# Patient Record
Sex: Male | Born: 1970 | Race: White | Hispanic: No | Smoking: Current every day smoker
Health system: Southern US, Community
[De-identification: ages and names within clinical notes are randomized; demographics above are authoritative.]

---

## 2020-11-26 DIAGNOSIS — Z20822 Contact with and (suspected) exposure to covid-19: Secondary | ICD-10-CM | POA: Diagnosis not present

## 2020-11-26 DIAGNOSIS — U071 COVID-19: Secondary | ICD-10-CM | POA: Diagnosis not present

## 2021-12-22 DIAGNOSIS — G44209 Tension-type headache, unspecified, not intractable: Secondary | ICD-10-CM | POA: Diagnosis not present

## 2021-12-26 ENCOUNTER — Encounter (HOSPITAL_COMMUNITY): Payer: Self-pay | Admitting: *Deleted

## 2021-12-26 ENCOUNTER — Emergency Department (HOSPITAL_COMMUNITY): Payer: BC Managed Care – PPO

## 2021-12-26 ENCOUNTER — Inpatient Hospital Stay (HOSPITAL_COMMUNITY)
Admission: EM | Admit: 2021-12-26 | Discharge: 2022-01-02 | DRG: 025 | Disposition: A | Discharge status: Rehabilitation Facility | Payer: BC Managed Care – PPO | Attending: Neurosurgery | Admitting: Neurosurgery

## 2021-12-26 DIAGNOSIS — Z20822 Contact with and (suspected) exposure to covid-19: Secondary | ICD-10-CM | POA: Diagnosis present

## 2021-12-26 DIAGNOSIS — Z7189 Other specified counseling: Secondary | ICD-10-CM | POA: Diagnosis not present

## 2021-12-26 DIAGNOSIS — G939 Disorder of brain, unspecified: Secondary | ICD-10-CM | POA: Diagnosis not present

## 2021-12-26 DIAGNOSIS — C719 Malignant neoplasm of brain, unspecified: Secondary | ICD-10-CM | POA: Diagnosis not present

## 2021-12-26 DIAGNOSIS — C729 Malignant neoplasm of central nervous system, unspecified: Secondary | ICD-10-CM | POA: Diagnosis not present

## 2021-12-26 DIAGNOSIS — I96 Gangrene, not elsewhere classified: Secondary | ICD-10-CM | POA: Diagnosis not present

## 2021-12-26 DIAGNOSIS — G936 Cerebral edema: Secondary | ICD-10-CM | POA: Diagnosis not present

## 2021-12-26 DIAGNOSIS — G9389 Other specified disorders of brain: Secondary | ICD-10-CM

## 2021-12-26 DIAGNOSIS — Z515 Encounter for palliative care: Secondary | ICD-10-CM | POA: Diagnosis not present

## 2021-12-26 DIAGNOSIS — G935 Compression of brain: Secondary | ICD-10-CM | POA: Diagnosis present

## 2021-12-26 DIAGNOSIS — R220 Localized swelling, mass and lump, head: Secondary | ICD-10-CM | POA: Diagnosis not present

## 2021-12-26 DIAGNOSIS — K5900 Constipation, unspecified: Secondary | ICD-10-CM | POA: Diagnosis not present

## 2021-12-26 DIAGNOSIS — E8809 Other disorders of plasma-protein metabolism, not elsewhere classified: Secondary | ICD-10-CM | POA: Diagnosis not present

## 2021-12-26 DIAGNOSIS — F17210 Nicotine dependence, cigarettes, uncomplicated: Secondary | ICD-10-CM | POA: Diagnosis present

## 2021-12-26 DIAGNOSIS — R269 Unspecified abnormalities of gait and mobility: Secondary | ICD-10-CM | POA: Diagnosis not present

## 2021-12-26 DIAGNOSIS — R4587 Impulsiveness: Secondary | ICD-10-CM | POA: Diagnosis present

## 2021-12-26 DIAGNOSIS — D496 Neoplasm of unspecified behavior of brain: Secondary | ICD-10-CM | POA: Diagnosis not present

## 2021-12-26 DIAGNOSIS — R739 Hyperglycemia, unspecified: Secondary | ICD-10-CM | POA: Diagnosis not present

## 2021-12-26 DIAGNOSIS — R5381 Other malaise: Secondary | ICD-10-CM | POA: Diagnosis not present

## 2021-12-26 DIAGNOSIS — E669 Obesity, unspecified: Secondary | ICD-10-CM | POA: Diagnosis not present

## 2021-12-26 DIAGNOSIS — C712 Malignant neoplasm of temporal lobe: Principal | ICD-10-CM | POA: Diagnosis present

## 2021-12-26 DIAGNOSIS — R519 Headache, unspecified: Secondary | ICD-10-CM | POA: Diagnosis not present

## 2021-12-26 DIAGNOSIS — Z6825 Body mass index (BMI) 25.0-25.9, adult: Secondary | ICD-10-CM | POA: Diagnosis not present

## 2021-12-26 DIAGNOSIS — R4189 Other symptoms and signs involving cognitive functions and awareness: Secondary | ICD-10-CM | POA: Diagnosis not present

## 2021-12-26 LAB — CBC WITH DIFFERENTIAL/PLATELET
Abs Immature Granulocytes: 0.05 10*3/uL (ref 0.00–0.07)
Basophils Absolute: 0.1 10*3/uL (ref 0.0–0.1)
Basophils Relative: 1 %
Eosinophils Absolute: 0.1 10*3/uL (ref 0.0–0.5)
Eosinophils Relative: 1 %
HCT: 55.5 % — ABNORMAL HIGH (ref 39.0–52.0)
Hemoglobin: 18 g/dL — ABNORMAL HIGH (ref 13.0–17.0)
Immature Granulocytes: 0 %
Lymphocytes Relative: 13 %
Lymphs Abs: 2.2 10*3/uL (ref 0.7–4.0)
MCH: 30.5 pg (ref 26.0–34.0)
MCHC: 32.4 g/dL (ref 30.0–36.0)
MCV: 93.9 fL (ref 80.0–100.0)
Monocytes Absolute: 0.7 10*3/uL (ref 0.1–1.0)
Monocytes Relative: 4 %
Neutro Abs: 13.3 10*3/uL — ABNORMAL HIGH (ref 1.7–7.7)
Neutrophils Relative %: 81 %
Platelets: 339 10*3/uL (ref 150–400)
RBC: 5.91 MIL/uL — ABNORMAL HIGH (ref 4.22–5.81)
RDW: 14.6 % (ref 11.5–15.5)
WBC: 16.4 10*3/uL — ABNORMAL HIGH (ref 4.0–10.5)
nRBC: 0 % (ref 0.0–0.2)

## 2021-12-26 LAB — RESP PANEL BY RT-PCR (FLU A&B, COVID) ARPGX2
Influenza A by PCR: NEGATIVE
Influenza B by PCR: NEGATIVE
SARS Coronavirus 2 by RT PCR: NEGATIVE

## 2021-12-26 LAB — COMPREHENSIVE METABOLIC PANEL
ALT: 23 U/L (ref 0–44)
AST: 14 U/L — ABNORMAL LOW (ref 15–41)
Albumin: 4.2 g/dL (ref 3.5–5.0)
Alkaline Phosphatase: 76 U/L (ref 38–126)
Anion gap: 11 (ref 5–15)
BUN: 14 mg/dL (ref 6–20)
CO2: 20 mmol/L — ABNORMAL LOW (ref 22–32)
Calcium: 9.1 mg/dL (ref 8.9–10.3)
Chloride: 104 mmol/L (ref 98–111)
Creatinine, Ser: 0.6 mg/dL — ABNORMAL LOW (ref 0.61–1.24)
GFR, Estimated: >60 mL/min (ref >60)
Glucose, Bld: 115 mg/dL — ABNORMAL HIGH (ref 70–99)
Potassium: 3.7 mmol/L (ref 3.5–5.1)
Sodium: 135 mmol/L (ref 135–145)
Total Bilirubin: 0.9 mg/dL (ref 0.3–1.2)
Total Protein: 7.7 g/dL (ref 6.5–8.1)

## 2021-12-26 LAB — LIPASE, BLOOD: Lipase: 25 U/L (ref 11–51)

## 2021-12-26 IMAGING — CT CT HEAD W/O CM
3 of 4 series · 15 of 47 positions shown, 18 images · non-contrast
Comparison: None.

CLINICAL DATA: Headaches, nausea, vomiting



[Series 2: head w o · axial · 0.42mm/px · z∈[+1398,+1523]mm · 9 of 31 slices shown, 12 images]
[im 3/31  brain]
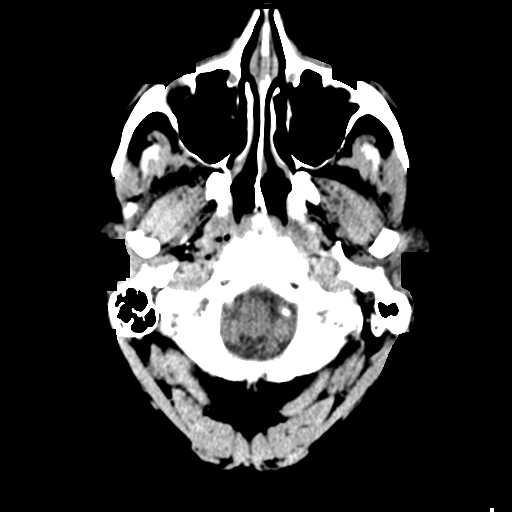
[im 3/31  bone]
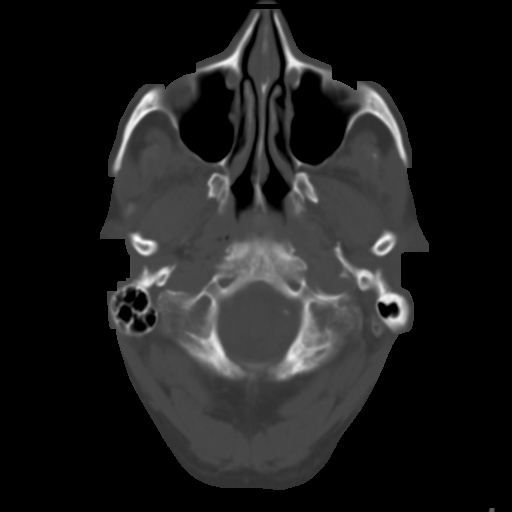
[im 7/31  brain]
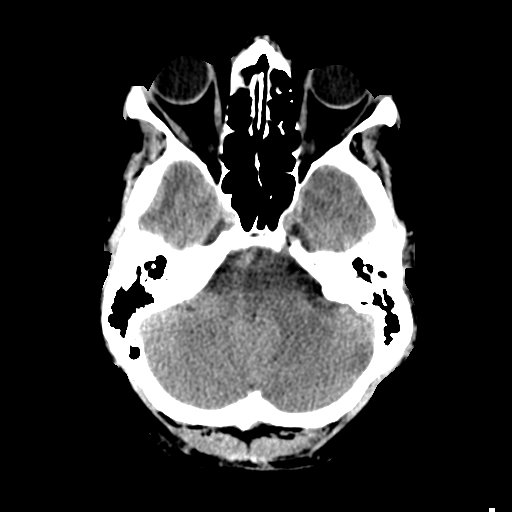
[im 9/31  brain]
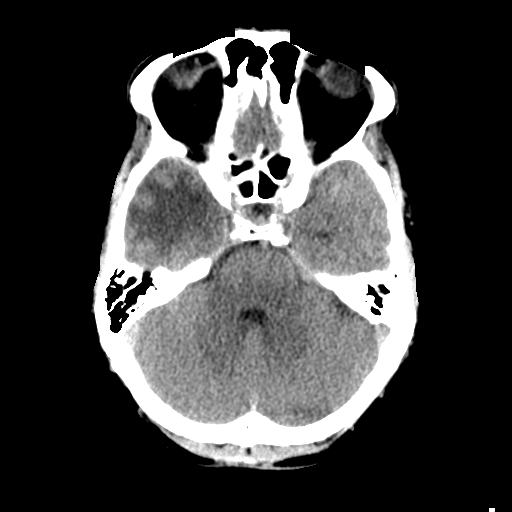
[im 13/31  brain]
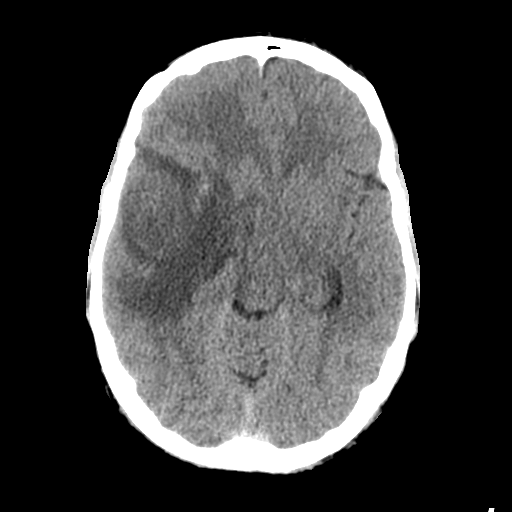
[im 16/31  brain]
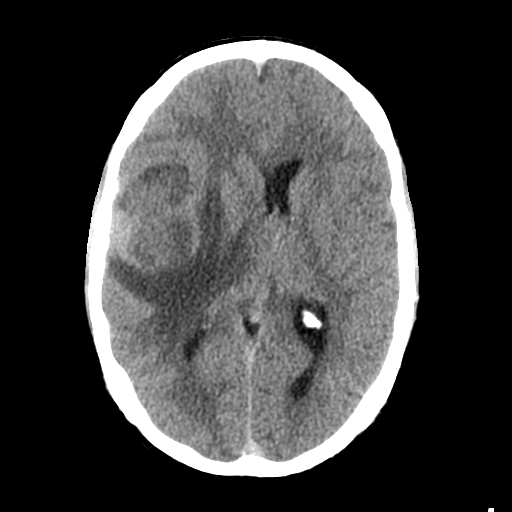
[im 16/31  bone]
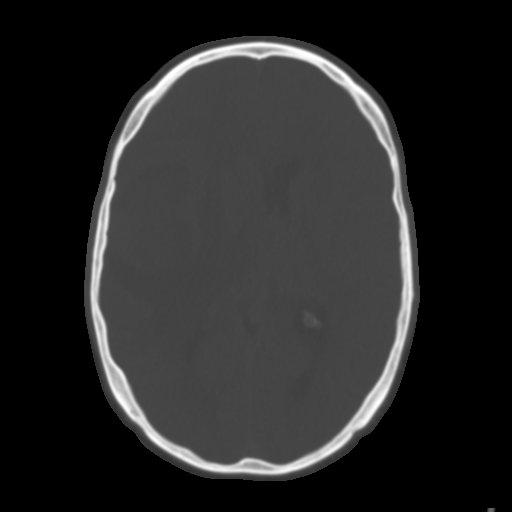
[im 18/31  brain]
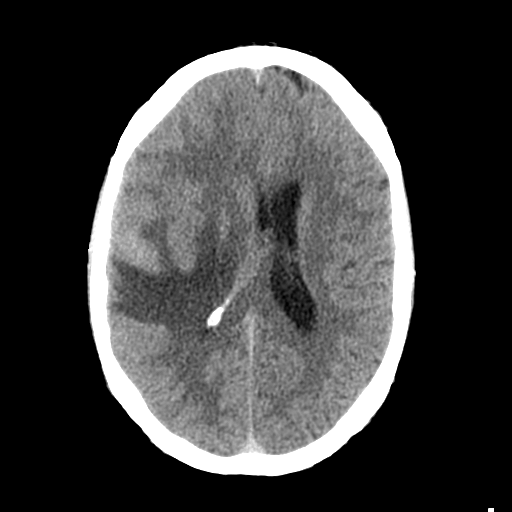
[im 22/31  brain]
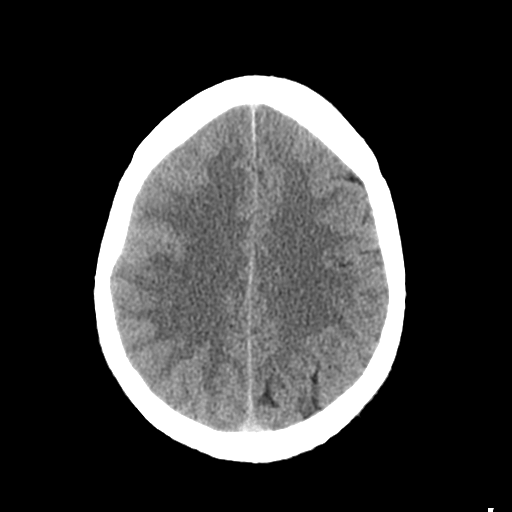
[im 24/31  brain]
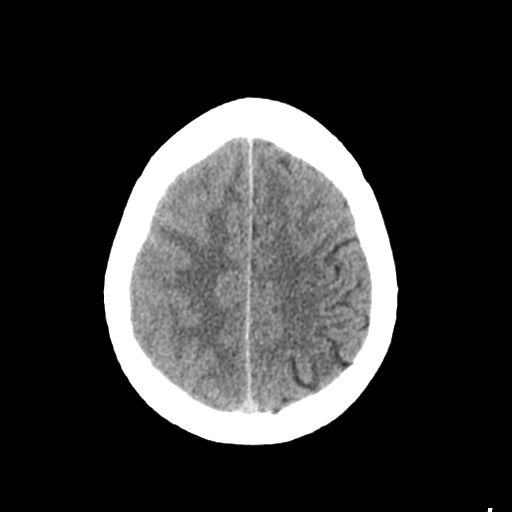
[im 28/31  brain]
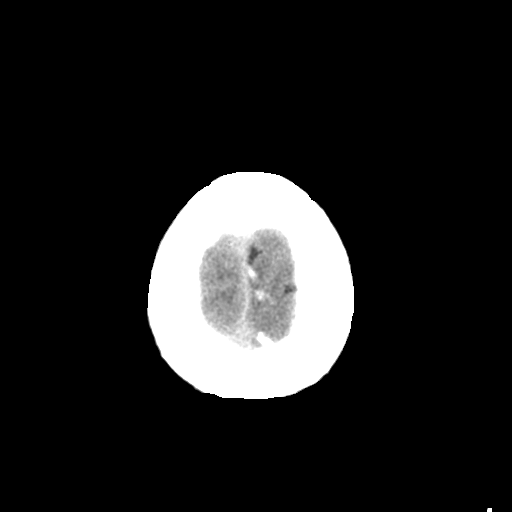
[im 28/31  bone]
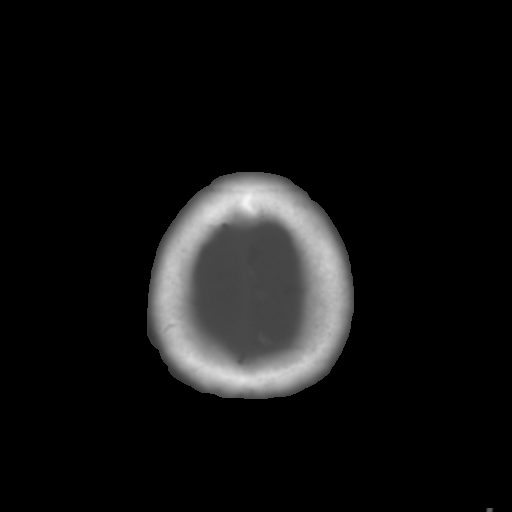

[Series 4: coronal soft · coronal · 0.31mm/px · 3 of 67 slices shown]
[im 23/67  brain]
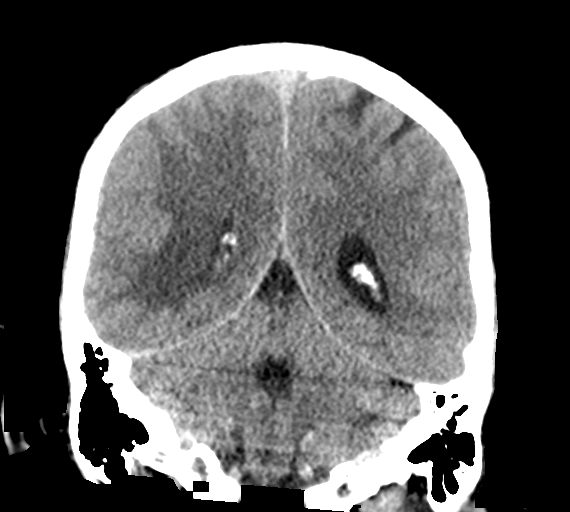
[im 30/67  brain]
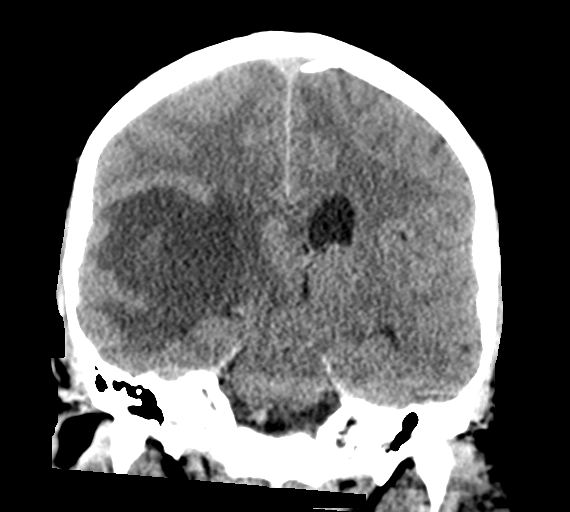
[im 37/67  brain]
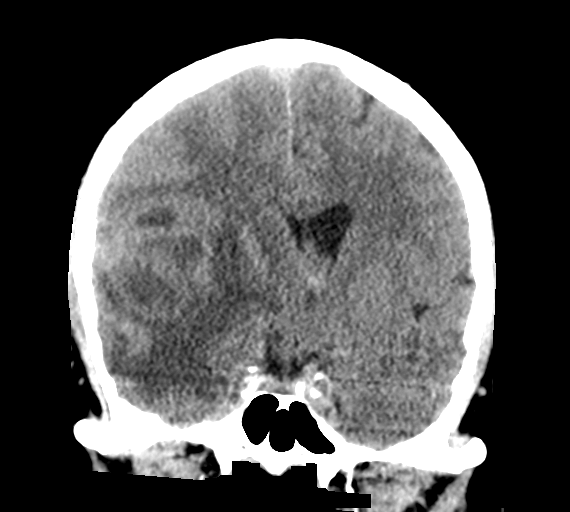

[Series 5: sagittal soft · sagittal · 0.35mm/px · 3 of 54 slices shown]
[im 18/54  brain]
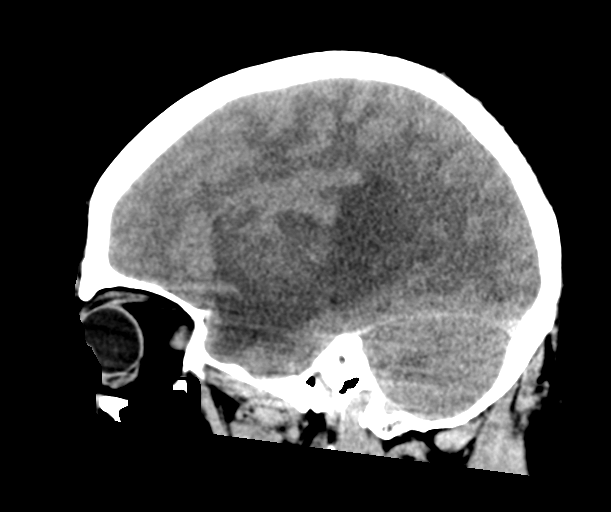
[im 27/54  brain]
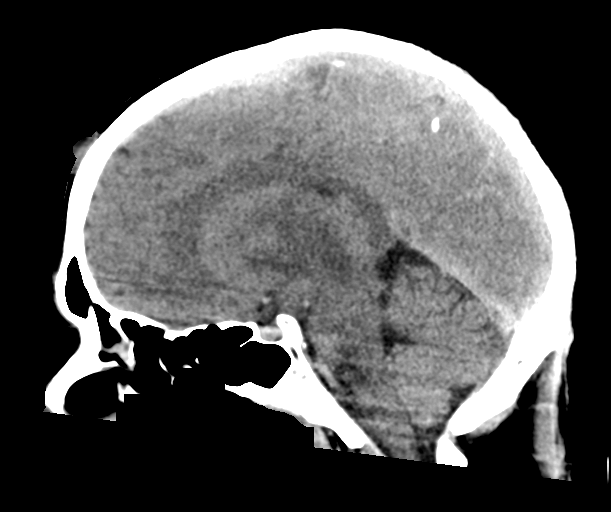
[im 36/54  brain]
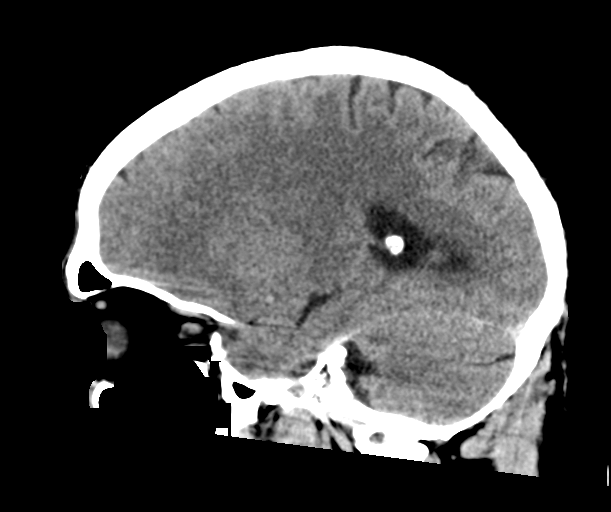

[15 of 47 positions shown; findings below may reference images not displayed]

FINDINGS: Brain: There is 4.4 x 3.4 cm area of focal inhomogeneous mass in the
right frontotemporal region. There is marked surrounding vasogenic
edema. There is extrinsic compression of right lateral ventricle.
There is a proximally 10 mm shift of midline structures to the left.
There is effacement of cortical sulci in the right cerebral
hemisphere. There are no epidural or subdural fluid collections. No
other discrete focal space-occupying lesions are seen.

Vascular: Unremarkable.

Skull: No fracture is seen. There is 2.3 cm smooth marginated
low-density lesion in the frontal scalp, possibly sebaceous cyst.

Sinuses/Orbits: There is mucosal thickening in the ethmoid sinus.

Other: None
IMPRESSION: There is a large 4.4 cm inhomogeneous mass in the right temporal
frontal cortex with surrounding vasogenic edema. There is mass
effect with effacement of cortical sulci in the right cerebral
hemisphere, extrinsic compression of right lateral ventricle and 10
mm shift of midline structures to the left. Follow-up contrast
enhanced MRI and neurosurgical consultation should be considered.

Imaging findings were relayed to Dr. YDALIA by telephone call.

## 2021-12-26 MED ORDER — METOPROLOL TARTRATE 5 MG/5ML IV SOLN: 5.0000 mg | Freq: Four times a day (QID) | INTRAVENOUS | Status: DC | PRN

## 2021-12-26 MED ORDER — DIPHENHYDRAMINE HCL 50 MG/ML IJ SOLN
50.0000 mg | Freq: Once | INTRAMUSCULAR | Status: AC
  Administered 2021-12-26: 50 mg via INTRAVENOUS
  Filled 2021-12-26: qty 1

## 2021-12-26 MED ORDER — ONDANSETRON HCL 4 MG/2ML IJ SOLN: 4.0000 mg | Freq: Once | INTRAMUSCULAR | Status: DC

## 2021-12-26 MED ORDER — METOCLOPRAMIDE HCL 5 MG/ML IJ SOLN
10.0000 mg | Freq: Once | INTRAMUSCULAR | Status: AC
Start: 2021-12-26 — End: 2021-12-26
  Administered 2021-12-26: 10 mg via INTRAVENOUS
  Filled 2021-12-26: qty 2

## 2021-12-26 MED ORDER — ONDANSETRON HCL 4 MG/2ML IJ SOLN: 4.0000 mg | Freq: Four times a day (QID) | INTRAMUSCULAR | Status: DC | PRN

## 2021-12-26 MED ORDER — DEXAMETHASONE SODIUM PHOSPHATE 4 MG/ML IJ SOLN
4.0000 mg | Freq: Four times a day (QID) | INTRAMUSCULAR | Status: DC
  Administered 2021-12-27 – 2021-12-29 (×10): 4 mg via INTRAVENOUS
  Filled 2021-12-26 (×10): qty 1

## 2021-12-26 MED ORDER — DOCUSATE SODIUM 100 MG PO CAPS
100.0000 mg | ORAL_CAPSULE | Freq: Two times a day (BID) | ORAL | Status: DC
  Administered 2021-12-27 – 2022-01-01 (×10): 100 mg via ORAL
  Filled 2021-12-26 (×13): qty 1

## 2021-12-26 MED ORDER — ONDANSETRON HCL 4 MG PO TABS: 4.0000 mg | ORAL_TABLET | Freq: Four times a day (QID) | ORAL | Status: DC | PRN

## 2021-12-26 MED ORDER — ZOLPIDEM TARTRATE 5 MG PO TABS
5.0000 mg | ORAL_TABLET | Freq: Every evening | ORAL | Status: DC | PRN
Start: 2021-12-26 — End: 2022-01-02

## 2021-12-26 MED ORDER — BISACODYL 10 MG RE SUPP: 10.0000 mg | Freq: Every day | RECTAL | Status: DC | PRN

## 2021-12-26 MED ORDER — HYDROCODONE-ACETAMINOPHEN 5-325 MG PO TABS
1.0000 | ORAL_TABLET | ORAL | Status: DC | PRN
  Administered 2021-12-28 – 2021-12-30 (×3): 1 via ORAL
  Administered 2021-12-31: 2 via ORAL
  Filled 2021-12-26: qty 1
  Filled 2021-12-26: qty 2
  Filled 2021-12-26 (×2): qty 1

## 2021-12-26 MED ORDER — METHOCARBAMOL 1000 MG/10ML IJ SOLN
500.0000 mg | Freq: Four times a day (QID) | INTRAVENOUS | Status: DC | PRN
  Administered 2021-12-28: 500 mg via INTRAVENOUS
  Filled 2021-12-26: qty 500
  Filled 2021-12-26: qty 5

## 2021-12-26 MED ORDER — HYDROMORPHONE HCL 1 MG/ML IJ SOLN
0.5000 mg | INTRAMUSCULAR | Status: DC | PRN
  Administered 2021-12-27 – 2021-12-28 (×2): 1 mg via INTRAVENOUS
  Filled 2021-12-26 (×2): qty 1

## 2021-12-26 MED ORDER — DEXAMETHASONE SODIUM PHOSPHATE 10 MG/ML IJ SOLN
10.0000 mg | Freq: Once | INTRAMUSCULAR | Status: AC
  Administered 2021-12-26: 10 mg via INTRAVENOUS
  Filled 2021-12-26: qty 1

## 2021-12-26 MED ORDER — ACETAMINOPHEN 325 MG PO TABS: 650.0000 mg | ORAL_TABLET | Freq: Four times a day (QID) | ORAL | Status: DC | PRN

## 2021-12-26 MED ORDER — POLYETHYLENE GLYCOL 3350 17 G PO PACK: 17.0000 g | PACK | Freq: Every day | ORAL | Status: DC | PRN

## 2021-12-26 MED ORDER — SODIUM CHLORIDE 0.9 % IV BOLUS
1000.0000 mL | Freq: Once | INTRAVENOUS | Status: AC
  Administered 2021-12-26: 1000 mL via INTRAVENOUS

## 2021-12-26 MED ORDER — FLEET ENEMA 7-19 GM/118ML RE ENEM: 1.0000 | ENEMA | Freq: Once | RECTAL | Status: DC | PRN

## 2021-12-26 MED ORDER — ACETAMINOPHEN 650 MG RE SUPP: 650.0000 mg | Freq: Four times a day (QID) | RECTAL | Status: DC | PRN

## 2021-12-26 NOTE — Progress Notes (Signed)
Received report from [place] at [facility].

## 2021-12-26 NOTE — ED Triage Notes (Signed)
VOMITING FOR OVER A WEEK

## 2021-12-26 NOTE — ED Provider Notes (Signed)
[place] Provider Note   CSN: [id number] Arrival date & time: 12/26/21  1636     History  Chief Complaint  Patient presents with   Emesis    Randy Gallagher is a 51 y.o. male who presents to the ED today with complaint of gradual onset, constant, sharp, R sided headache for the past week.  Patient reports he does not typically get headaches.  He went to urgent care last week and was provided a Toradol injection.  He states this lasted about 7 hours before dissipating.  He states he has continued to have a headache throughout the weekend and today began having nausea and nonbloody nonbilious emesis.  He states he has he has been taking sinus headache pain medication, Tylenol, Motrin without relief of his symptoms.  He denies any fevers, chills, blurry vision, double vision, rash, unilateral weakness or numbness, confusion, neck stiffness.  He does endorse photophobia.  Denies any recent sick contacts.   The history is provided by the patient and medical records.      Home Medications Prior to Admission medications   Not on File      Allergies    Patient has no known allergies.    Review of Systems   Review of Systems  Constitutional:  Negative for chills and fever.  Eyes:  Positive for photophobia. Negative for visual disturbance.  Gastrointestinal:  Positive for nausea and vomiting. Negative for abdominal pain, constipation and diarrhea.  Neurological:  Positive for headaches. Negative for dizziness, syncope, speech difficulty, weakness and light-headedness.  All other systems reviewed and are negative.  Physical Exam Updated Vital Signs BP 135/86 (BP Location: Right Arm)    Pulse 79    Temp 97.8 F (36.6 C) (Oral)    Resp 18    SpO2 97%  Physical Exam Vitals and nursing note reviewed.  Constitutional:      Appearance: He is not ill-appearing or diaphoretic.  HENT:     Head: Normocephalic and atraumatic.  Eyes:     Extraocular Movements:  Extraocular movements intact.     Conjunctiva/sclera: Conjunctivae normal.     Pupils: Pupils are equal, round, and reactive to light.  Cardiovascular:     Rate and Rhythm: Normal rate and regular rhythm.     Pulses: Normal pulses.  Pulmonary:     Effort: Pulmonary effort is normal.     Breath sounds: Normal breath sounds. No wheezing, rhonchi or rales.  Abdominal:     Palpations: Abdomen is soft.     Tenderness: There is no abdominal tenderness.  Musculoskeletal:     Cervical back: Neck supple.  Skin:    General: Skin is warm and dry.  Neurological:     Mental Status: He is alert.     Comments: Alert and oriented to self, place, time and event.   Speech is fluent, clear without dysarthria or dysphasia.   Strength 5/5 in upper/lower extremities   Sensation intact in upper/lower extremities   Normal gait.  Negative Romberg. No pronator drift.  Normal finger-to-nose and feet tapping.  CN I not tested  CN II grossly intact visual fields bilaterally. Did not visualize posterior eye.  CN III, IV, VI PERRLA and EOMs intact bilaterally  CN V Intact sensation to sharp and light touch to the face  CN VII facial movements symmetric  CN VIII not tested  CN IX, X no uvula deviation, symmetric rise of soft palate  CN XI 5/5 SCM and trapezius strength bilaterally  CN XII Midline tongue protrusion, symmetric L/R movements      ED Results / Procedures / Treatments   Labs (all labs ordered are listed, but only abnormal results are displayed) Labs Reviewed  COMPREHENSIVE METABOLIC PANEL - Abnormal; Notable for the following components:      Result Value   CO2 20 (*)    Glucose, Bld 115 (*)    Creatinine, Ser 0.60 (*)    AST 14 (*)    All other components within normal limits  CBC WITH DIFFERENTIAL/PLATELET - Abnormal; Notable for the following components:   WBC 16.4 (*)    RBC 5.91 (*)    Hemoglobin 18.0 (*)    HCT 55.5 (*)    Neutro Abs 13.3 (*)    All other components within  normal limits  RESP PANEL BY RT-PCR (FLU A&B, COVID) ARPGX2  LIPASE, BLOOD  URINALYSIS, ROUTINE W REFLEX MICROSCOPIC    EKG None  Radiology CT Head Wo Contrast  Result Date: 12/26/2021 CLINICAL DATA:  Headaches, nausea, vomiting EXAM: CT HEAD WITHOUT CONTRAST TECHNIQUE: Contiguous axial images were obtained from the base of the skull through the vertex without intravenous contrast. RADIATION DOSE REDUCTION: This exam was performed according to the departmental dose-optimization program which includes automated exposure control, adjustment of the mA and/or kV according to patient size and/or use of iterative reconstruction technique. COMPARISON:  None. FINDINGS: Brain: There is 4.4 x 3.4 cm area of focal inhomogeneous mass in the right frontotemporal region. There is marked surrounding vasogenic edema. There is extrinsic compression of right lateral ventricle. There is a proximally 10 mm shift of midline structures to the left. There is effacement of cortical sulci in the right cerebral hemisphere. There are no epidural or subdural fluid collections. No other discrete focal space-occupying lesions are seen. Vascular: Unremarkable. Skull: No fracture is seen. There is 2.3 cm smooth marginated low-density lesion in the frontal scalp, possibly sebaceous cyst. Sinuses/Orbits: There is mucosal thickening in the ethmoid sinus. Other: None IMPRESSION: There is a large 4.4 cm inhomogeneous mass in the right temporal frontal cortex with surrounding vasogenic edema. There is mass effect with effacement of cortical sulci in the right cerebral hemisphere, extrinsic compression of right lateral ventricle and 10 mm shift of midline structures to the left. Follow-up contrast enhanced MRI and neurosurgical consultation should be considered. Imaging findings were relayed to Dr. Roderic Palau by telephone call. Electronically Signed   By: Elmer Picker M.D.   On: 12/26/2021 19:58    Procedures Procedures     Medications Ordered in ED Medications  dexamethasone (DECADRON) injection 10 mg (has no administration in time range)  dexamethasone (DECADRON) injection 4 mg (has no administration in time range)  sodium chloride 0.9 % bolus 1,000 mL (1,000 mLs Intravenous New Bag/Given 12/26/21 1934)  metoCLOPramide (REGLAN) injection 10 mg (10 mg Intravenous Given 12/26/21 1956)  diphenhydrAMINE (BENADRYL) injection 50 mg (50 mg Intravenous Given 12/26/21 1955)    ED Course/ Medical Decision Making/ A&P Clinical Course as of 12/26/21 2048  Mon Dec 26, 2021  2028 10 decadron now. Q6 hours.  [MV]    Clinical Course User Index [MV] Eustaquio Maize, PA-C                           Medical Decision Making 51 year old male who presents to the ED today with complaint of recurrent headache x1 week with new onset nausea and nonbloody nonbilious emesis that began today.  On arrival  to the ED vitals are stable.  She was triaged as vomiting x1 week which he denies.  I had initially ordered abdominal labs prior to patient being seen including a lipase which I do not feel will be appropriate in this setting.  On my exam he has no focal neurodeficits and no meningeal signs however given new onset headache with age we will plan for CT head for further evaluation.  We will proceed with CBC and CMP for further evaluation.  Will provide fluids, antiemetics, Benadryl and plan for Toradol if CT head negative.  Suspect migraine at this time.  Patient had COVID and flu testing done while in the waiting room which is negative at this time however denies any specific URI-like complaints.   Amount and/or Complexity of Data Reviewed Labs: ordered.    Details: CBC with leukocytosis 16,400. RBC, hgb, and hct elevated as well. Fluids running.  CMP with glucose 115. Bicarb 20. No gap. Creatinine 0.60. No other electrolyte abnormalities. Radiology: ordered.    Details: IMPRESSION:  There is a large 4.4 cm inhomogeneous mass in the  right temporal  frontal cortex with surrounding vasogenic edema. There is mass  effect with effacement of cortical sulci in the right cerebral  hemisphere, extrinsic compression of right lateral ventricle and 10  mm shift of midline structures to the left. Follow-up contrast  enhanced MRI and neurosurgical consultation should be considered. Discussion of management or test interpretation with external provider(s): Attending physician Dr. Roderic Palau received call from radiologist regarding CT head - R sided temporal mass with mass effect  Discussed case with Arnetha Massy, NP with neurosurgery who will admit patient to their service at [facility] with plans for MRI once he arrives. She will place orders for decadron now and q6 hours as well as admission orders  Risk Prescription drug management. Decision regarding hospitalization.          Final Clinical Impression(s) / ED Diagnoses Final diagnoses:  Brain mass    Rx / DC Orders ED Discharge Orders     None         Eustaquio Maize, PA-C 12/26/21 2048    Milton Ferguson, MD 12/27/21 1551

## 2021-12-27 ENCOUNTER — Inpatient Hospital Stay (HOSPITAL_COMMUNITY): Payer: BC Managed Care – PPO

## 2021-12-27 ENCOUNTER — Encounter (HOSPITAL_COMMUNITY): Payer: Self-pay | Admitting: Neurosurgery

## 2021-12-27 ENCOUNTER — Other Ambulatory Visit: Payer: Self-pay

## 2021-12-27 ENCOUNTER — Inpatient Hospital Stay (HOSPITAL_COMMUNITY)
Admission: EM | Disposition: A | Discharge status: Rehabilitation Facility | Payer: Self-pay | Source: Home / Self Care | Attending: Neurosurgery

## 2021-12-27 ENCOUNTER — Inpatient Hospital Stay (HOSPITAL_COMMUNITY): Payer: BC Managed Care – PPO | Admitting: Anesthesiology

## 2021-12-27 HISTORY — PX: CRANIOTOMY: SHX93

## 2021-12-27 LAB — SURGICAL PCR SCREEN
MRSA, PCR: NEGATIVE
Staphylococcus aureus: NEGATIVE

## 2021-12-27 LAB — TYPE AND SCREEN
ABO/RH(D): A POS
Antibody Screen: NEGATIVE

## 2021-12-27 LAB — MRSA NEXT GEN BY PCR, NASAL: MRSA by PCR Next Gen: NOT DETECTED

## 2021-12-27 LAB — ABO/RH: ABO/RH(D): A POS

## 2021-12-27 LAB — HIV ANTIBODY (ROUTINE TESTING W REFLEX): HIV Screen 4th Generation wRfx: NONREACTIVE

## 2021-12-27 IMAGING — MR MR HEAD WO/W CM
18 of 24 series · 32 of 48 positions shown · IV contrast (gadavist)
Comparison: Preceding head CT without contrast

CLINICAL DATA: CNS neoplasm

EXAM:
MRI HEAD WITHOUT AND WITH CONTRAST
TECHNIQUE: Multiplanar, multiecho pulse sequences of the brain and surrounding
structures were obtained without and with intravenous contrast.
CONTRAST:  9mL GADAVIST GADOBUTROL 1 MMOL/ML IV SOLN

[Series 5: DWI · axial · 3.0mm · 0.96mm/px · z∈[-78,+86]mm · 2 of 115 slices shown (1 of 4)]
[im 1/115]
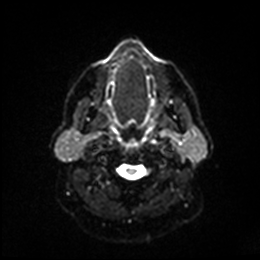
[im 115/115]
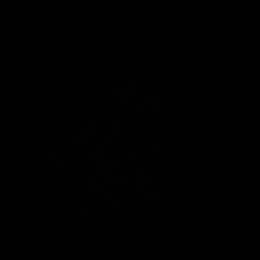

[Series 6: DWI · axial · 3.0mm · 0.96mm/px · 1 of 56 slices shown (2 of 4)]
[im 1/56]
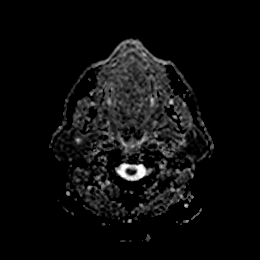

[Series 7: DWI · coronal · 4.0mm · 0.88mm/px · 1 of 82 slices shown (3 of 4)]
[im 1/82]
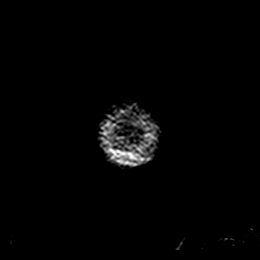

[Series 8: DWI · coronal · 4.0mm · 0.88mm/px · 1 of 41 slices shown (4 of 4)]
[im 1/41]
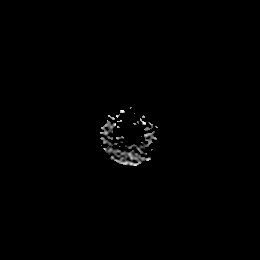

[Series 13: t2_space_dark-fluid_sag_p2_ns-ir · sagittal · 1.0mm · 0.49mm/px · 5 of 176 slices shown]
[im 1/176]
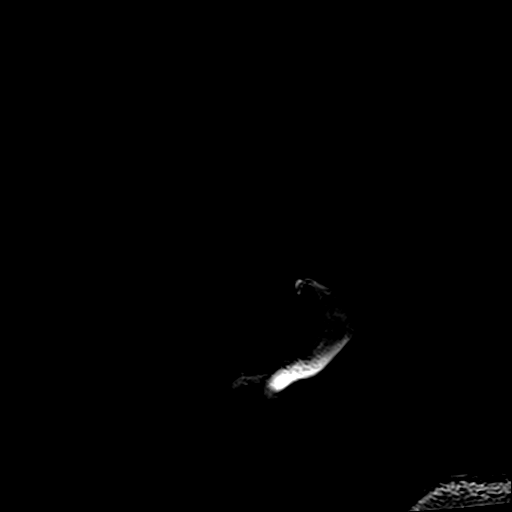
[im 44/176]
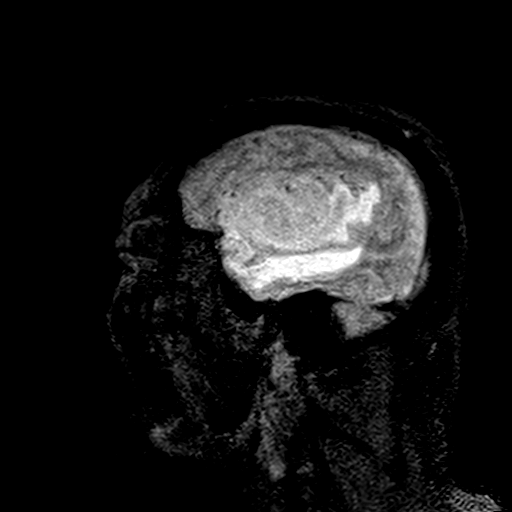
[im 88/176]
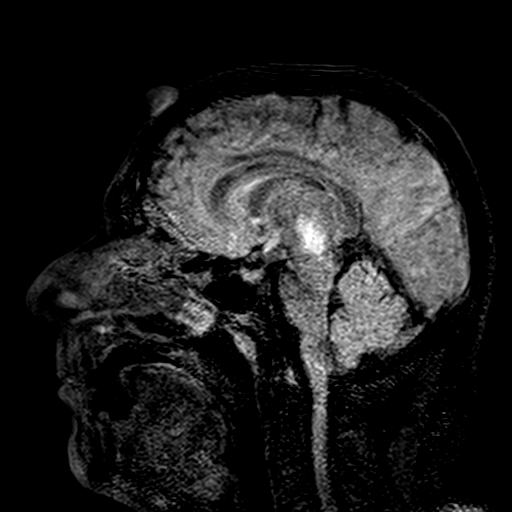
[im 132/176]
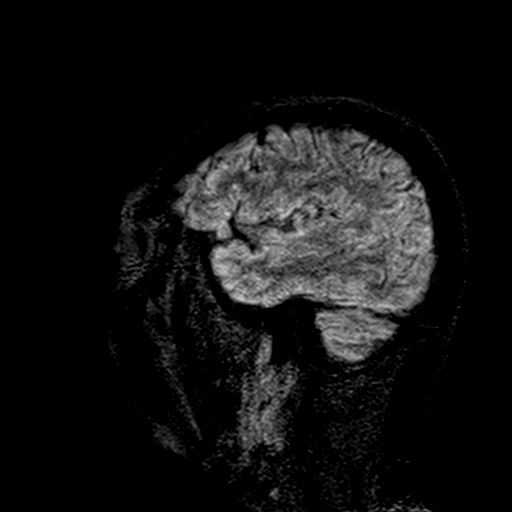
[im 176/176]
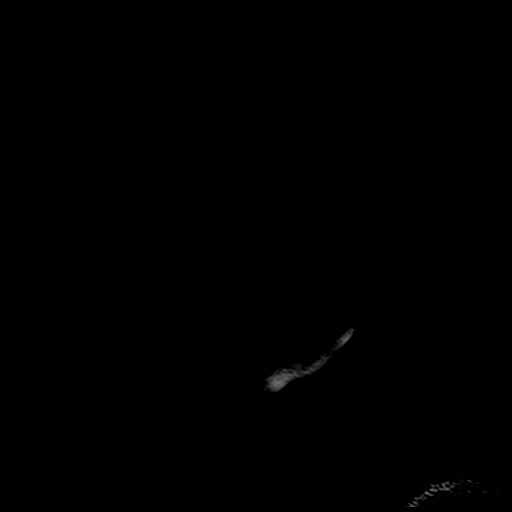

[Series 14: t2_space_dark-fluid_sag_p2_ns-ir_mpr_ axial · axial · 1.0mm · 0.49mm/px · z∈[-46,+72]mm · 3 of 126 slices shown]
[im 1/126]
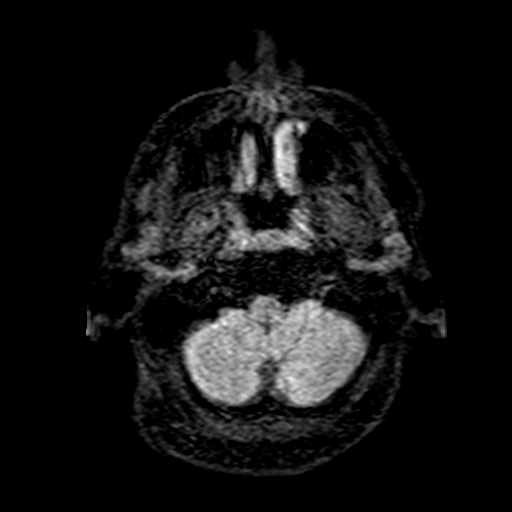
[im 63/126]
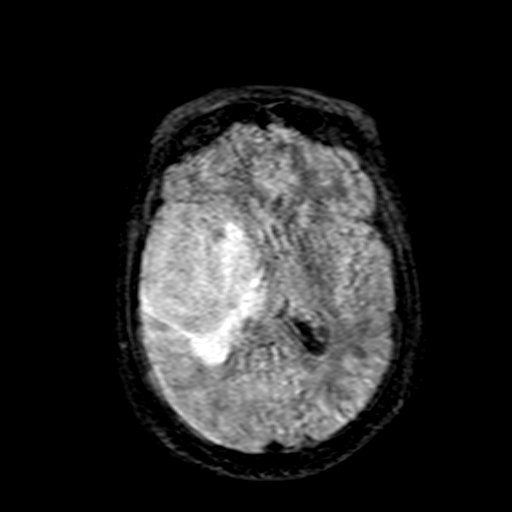
[im 126/126]
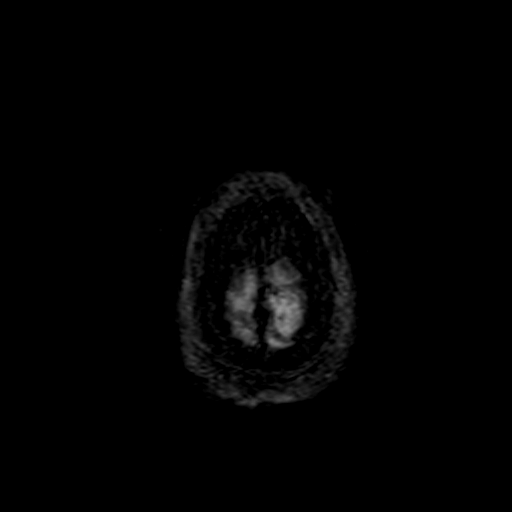

[Series 15: t2_space_dark-fluid_sag_p2_ns-ir_mpr_coronal · coronal · 1.0mm · 0.45mm/px · 3 of 130 slices shown]
[im 1/130]
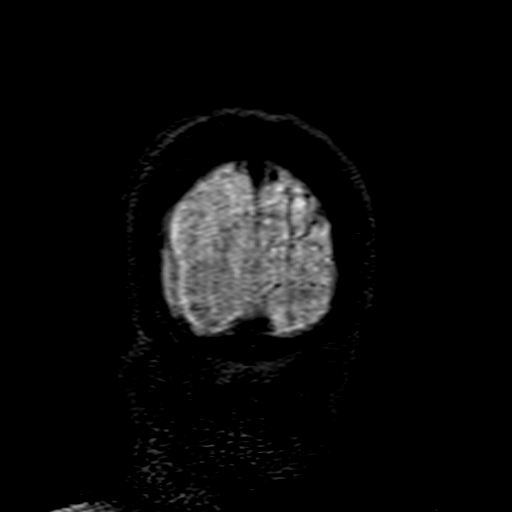
[im 65/130]
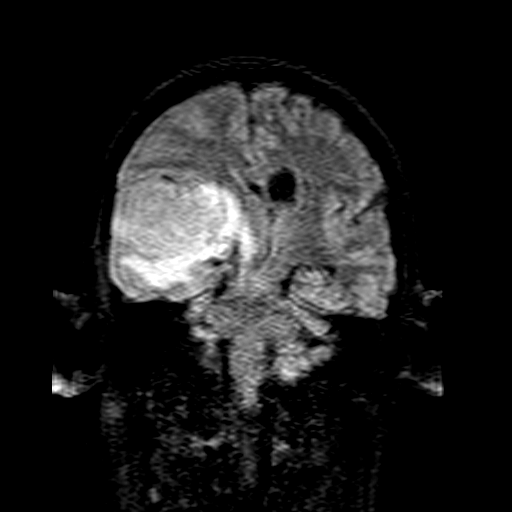
[im 130/130]
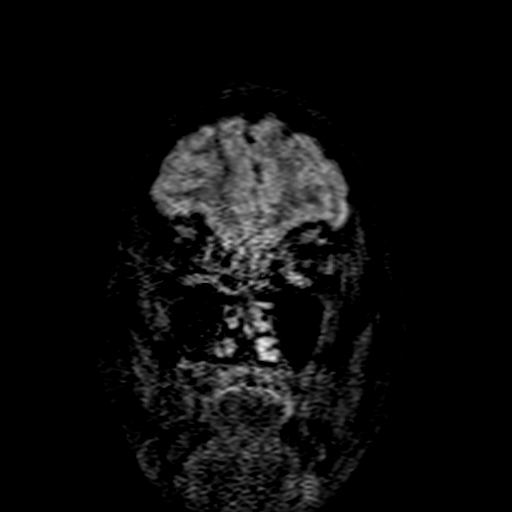

[Series 16: T1 · sagittal · 5.0mm · 0.78mm/px · 1 of 27 slices shown (1 of 2)]
[im 1/27]
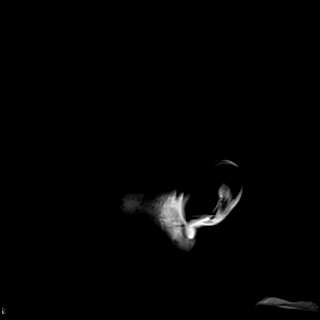

[Series 17: t1_mprage_tra_p2_iso no angle · axial · 1.0mm · 0.98mm/px · z∈[-86,+84]mm · 5 of 175 slices shown]
[im 1/175]
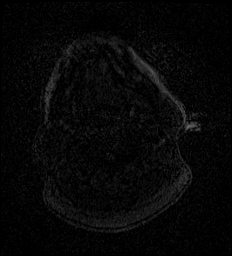
[im 44/175]
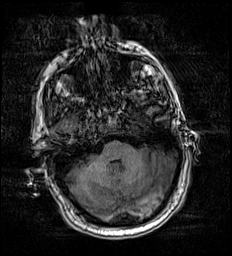
[im 88/175]
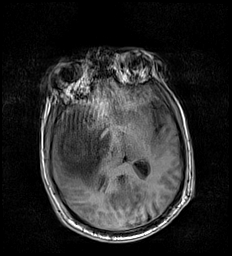
[im 131/175]
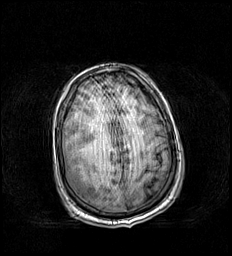
[im 175/175]
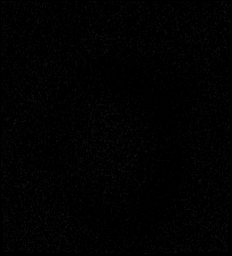

[Series 18: T2 · axial · 5.0mm · 0.78mm/px · 1 of 31 slices shown]
[im 1/31]
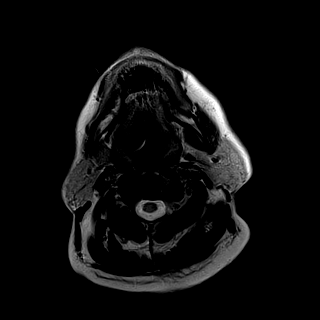

[Series 19: FLAIR · axial · 5.0mm · 0.98mm/px · 1 of 30 slices shown]
[im 1/30]
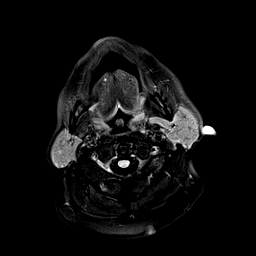

[Series 20: T1 · sagittal · 5.0mm · 0.94mm/px · 1 of 28 slices shown (2 of 2)]
[im 1/28]
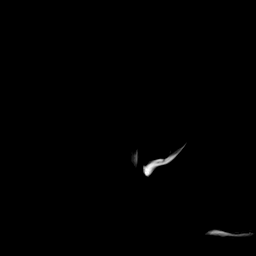

[Series 21: mag_images · axial · 3.0mm · 0.98mm/px · z∈[-68,+98]mm · 2 of 60 slices shown]
[im 1/60]
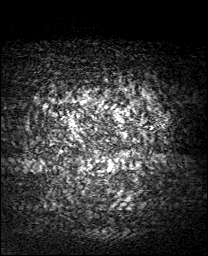
[im 60/60]
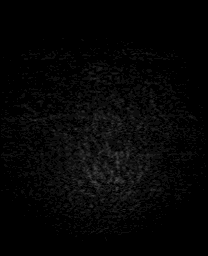

[Series 22: pha_images · axial · 3.0mm · 0.98mm/px · 1 of 48 slices shown]
[im 1/48]
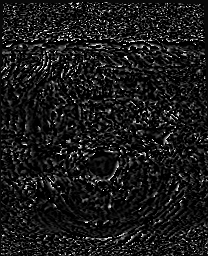

[Series 27: T2 post-contrast · coronal · 5.0mm · 0.72mm/px · 1 of 34 slices shown]
[im 1/34]
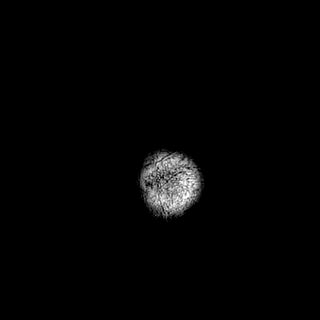

[Series 29: T1 post-contrast · coronal · 5.0mm · 0.34mm/px · 1 of 34 slices shown (1 of 3)]
[im 1/34]
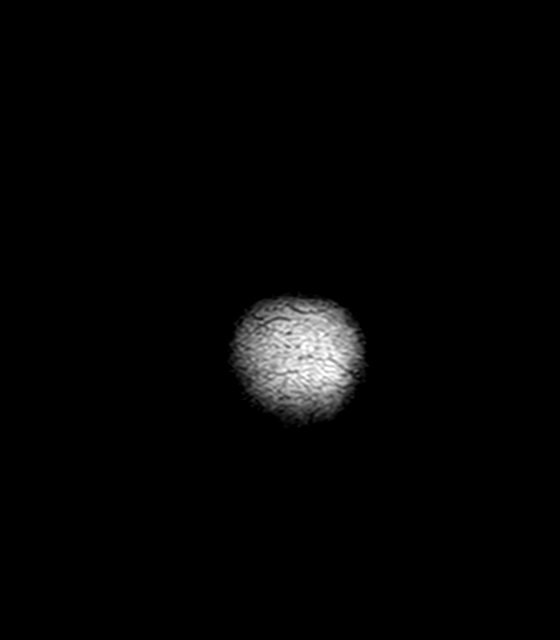

[Series 30: T1 post-contrast · sagittal · 5.0mm · 0.75mm/px · 1 of 30 slices shown (2 of 3)]
[im 1/30]
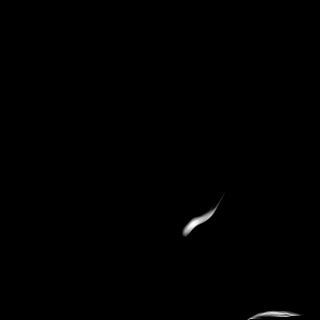

[Series 32: T1 post-contrast · coronal · 5.0mm · 0.34mm/px · 1 of 34 slices shown (3 of 3)]
[im 1/34]
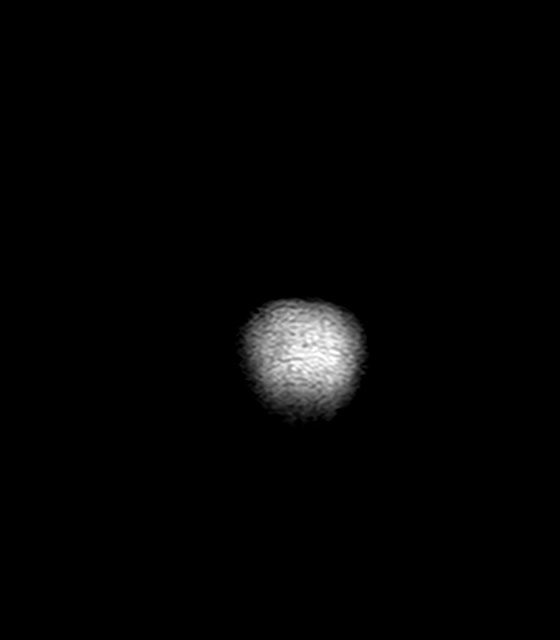

[32 of 48 positions shown; findings below may reference images not displayed]

FINDINGS: Brain: Readily identified superior right temporal lobe mass
measuring up to 4.2 cm. The mass has irregular peripheral
enhancement suggesting necrosis . The periphery show signs of dense
cellularity by T2 and diffusion imaging. No central purulent
appearance of by diffusion imaging. Extensive adjacent T2
hyperintensity with swelling. Midline shift measures up to 1 cm at
the level of the third ventricle. No second mass is seen. No acute
hemorrhage by CT. No entrapment or infarct.

Vascular: Major vessels are enhancing

Skull and upper cervical spine: Normal marrow signal

Sinuses/Orbits: Negative

Other: Motion degraded.
IMPRESSION: 4.2 cm necrotic appearing right superior temporal lobe mass most
worrisome for a high-grade glioma. T2 hyperintense swelling causes
local mass effect and 1 cm of midline shift.

Motion degraded.

## 2021-12-27 SURGERY — CRANIOTOMY TUMOR EXCISION
Anesthesia: General | Laterality: Right

## 2021-12-27 MED ORDER — FENTANYL CITRATE (PF) 250 MCG/5ML IJ SOLN
INTRAMUSCULAR | Status: AC
  Filled 2021-12-27: qty 5

## 2021-12-27 MED ORDER — THROMBIN 5000 UNITS EX SOLR
CUTANEOUS | Status: AC
  Filled 2021-12-27: qty 5000

## 2021-12-27 MED ORDER — PHENYLEPHRINE HCL-NACL 20-0.9 MG/250ML-% IV SOLN
INTRAVENOUS | Status: DC | PRN
  Administered 2021-12-27: 10 ug/min via INTRAVENOUS

## 2021-12-27 MED ORDER — THROMBIN 5000 UNITS EX SOLR
CUTANEOUS | Status: AC
  Filled 2021-12-27: qty 10000

## 2021-12-27 MED ORDER — SUGAMMADEX SODIUM 200 MG/2ML IV SOLN
INTRAVENOUS | Status: DC | PRN
  Administered 2021-12-27: 400 mg via INTRAVENOUS

## 2021-12-27 MED ORDER — SODIUM CHLORIDE 0.9 % IV SOLN: INTRAVENOUS | Status: DC

## 2021-12-27 MED ORDER — MANNITOL 25 % IV SOLN
INTRAVENOUS | Status: DC | PRN
  Administered 2021-12-27: 60 g via INTRAVENOUS

## 2021-12-27 MED ORDER — CHLORHEXIDINE GLUCONATE 0.12 % MT SOLN: 15.0000 mL | Freq: Once | OROMUCOSAL | Status: AC

## 2021-12-27 MED ORDER — PHENYLEPHRINE 40 MCG/ML (10ML) SYRINGE FOR IV PUSH (FOR BLOOD PRESSURE SUPPORT)
PREFILLED_SYRINGE | INTRAVENOUS | Status: DC | PRN
  Administered 2021-12-27 (×4): 80 ug via INTRAVENOUS

## 2021-12-27 MED ORDER — CEFAZOLIN SODIUM-DEXTROSE 2-4 GM/100ML-% IV SOLN
2.0000 g | Freq: Three times a day (TID) | INTRAVENOUS | Status: AC
  Administered 2021-12-28 (×2): 2 g via INTRAVENOUS
  Filled 2021-12-27 (×2): qty 100

## 2021-12-27 MED ORDER — ONDANSETRON HCL 4 MG/2ML IJ SOLN
4.0000 mg | INTRAMUSCULAR | Status: DC | PRN
  Administered 2021-12-28: 4 mg via INTRAVENOUS
  Filled 2021-12-27: qty 2

## 2021-12-27 MED ORDER — HEMOSTATIC AGENTS (NO CHARGE) OPTIME
TOPICAL | Status: DC | PRN
  Administered 2021-12-27: 1 via TOPICAL

## 2021-12-27 MED ORDER — LORAZEPAM 2 MG/ML IJ SOLN
INTRAMUSCULAR | Status: AC
  Filled 2021-12-27: qty 1

## 2021-12-27 MED ORDER — MIDAZOLAM HCL 2 MG/2ML IJ SOLN
INTRAMUSCULAR | Status: AC
  Filled 2021-12-27: qty 2

## 2021-12-27 MED ORDER — ESMOLOL HCL 100 MG/10ML IV SOLN
INTRAVENOUS | Status: DC | PRN
  Administered 2021-12-27: 60 mg via INTRAVENOUS
  Administered 2021-12-27: 20 mg via INTRAVENOUS

## 2021-12-27 MED ORDER — LEVETIRACETAM IN NACL 500 MG/100ML IV SOLN
500.0000 mg | Freq: Two times a day (BID) | INTRAVENOUS | Status: DC
  Administered 2021-12-27 – 2021-12-29 (×4): 500 mg via INTRAVENOUS
  Filled 2021-12-27 (×4): qty 100

## 2021-12-27 MED ORDER — GELATIN ABSORBABLE 100 EX MISC
CUTANEOUS | Status: DC | PRN
  Administered 2021-12-27: 20 mL via TOPICAL

## 2021-12-27 MED ORDER — LACTATED RINGERS IV SOLN: INTRAVENOUS | Status: DC | PRN

## 2021-12-27 MED ORDER — PROPOFOL 10 MG/ML IV BOLUS
INTRAVENOUS | Status: AC
  Filled 2021-12-27: qty 20

## 2021-12-27 MED ORDER — BACITRACIN ZINC 500 UNIT/GM EX OINT
TOPICAL_OINTMENT | CUTANEOUS | Status: AC
  Filled 2021-12-27: qty 28.35

## 2021-12-27 MED ORDER — PROMETHAZINE HCL 25 MG PO TABS: 12.5000 mg | ORAL_TABLET | ORAL | Status: DC | PRN

## 2021-12-27 MED ORDER — THROMBIN 20000 UNITS EX SOLR
CUTANEOUS | Status: AC
  Filled 2021-12-27: qty 20000

## 2021-12-27 MED ORDER — BACITRACIN ZINC 500 UNIT/GM EX OINT
TOPICAL_OINTMENT | CUTANEOUS | Status: DC | PRN
  Administered 2021-12-27: 1 via TOPICAL

## 2021-12-27 MED ORDER — LABETALOL HCL 5 MG/ML IV SOLN: 10.0000 mg | INTRAVENOUS | Status: DC | PRN

## 2021-12-27 MED ORDER — CEFAZOLIN SODIUM-DEXTROSE 2-4 GM/100ML-% IV SOLN
2.0000 g | INTRAVENOUS | Status: AC
  Administered 2021-12-27: 2 g via INTRAVENOUS
  Filled 2021-12-27: qty 100

## 2021-12-27 MED ORDER — FENTANYL CITRATE (PF) 100 MCG/2ML IJ SOLN: 25.0000 ug | INTRAMUSCULAR | Status: DC | PRN

## 2021-12-27 MED ORDER — LIDOCAINE-EPINEPHRINE 1 %-1:100000 IJ SOLN
INTRAMUSCULAR | Status: DC | PRN
  Administered 2021-12-27: 20 mL

## 2021-12-27 MED ORDER — MIDAZOLAM HCL 2 MG/2ML IJ SOLN
2.0000 mg | Freq: Once | INTRAMUSCULAR | Status: AC
  Administered 2021-12-27: 2 mg via INTRAVENOUS

## 2021-12-27 MED ORDER — LEVETIRACETAM IN NACL 500 MG/100ML IV SOLN
500.0000 mg | Freq: Two times a day (BID) | INTRAVENOUS | Status: DC
  Administered 2021-12-27: 500 mg via INTRAVENOUS
  Filled 2021-12-27: qty 100

## 2021-12-27 MED ORDER — LIDOCAINE 2% (20 MG/ML) 5 ML SYRINGE
INTRAMUSCULAR | Status: AC
  Filled 2021-12-27: qty 5

## 2021-12-27 MED ORDER — LORAZEPAM 2 MG/ML IJ SOLN
0.5000 mg | Freq: Once | INTRAMUSCULAR | Status: AC
  Administered 2021-12-27: 0.5 mg via INTRAVENOUS

## 2021-12-27 MED ORDER — GADOBUTROL 1 MMOL/ML IV SOLN
9.0000 mL | Freq: Once | INTRAVENOUS | Status: AC | PRN
  Administered 2021-12-27: 9 mL via INTRAVENOUS

## 2021-12-27 MED ORDER — SODIUM CHLORIDE 0.9 % IR SOLN
Status: DC | PRN
Start: 2021-12-27 — End: 2021-12-27
  Administered 2021-12-27: 3000 mL

## 2021-12-27 MED ORDER — LIDOCAINE 2% (20 MG/ML) 5 ML SYRINGE
INTRAMUSCULAR | Status: DC | PRN
  Administered 2021-12-27: 40 mg via INTRAVENOUS

## 2021-12-27 MED ORDER — PROPOFOL 10 MG/ML IV BOLUS
INTRAVENOUS | Status: DC | PRN
  Administered 2021-12-27: 50 mg via INTRAVENOUS
  Administered 2021-12-27: 200 mg via INTRAVENOUS
  Administered 2021-12-27: 10 mg via INTRAVENOUS

## 2021-12-27 MED ORDER — ORAL CARE MOUTH RINSE: 15.0000 mL | Freq: Once | OROMUCOSAL | Status: AC

## 2021-12-27 MED ORDER — CHLORHEXIDINE GLUCONATE 0.12 % MT SOLN
OROMUCOSAL | Status: AC
  Administered 2021-12-27: 15 mL via OROMUCOSAL
  Filled 2021-12-27: qty 15

## 2021-12-27 MED ORDER — LIDOCAINE-EPINEPHRINE 1 %-1:100000 IJ SOLN
INTRAMUSCULAR | Status: AC
  Filled 2021-12-27: qty 1

## 2021-12-27 MED ORDER — LABETALOL HCL 5 MG/ML IV SOLN
INTRAVENOUS | Status: DC | PRN
  Administered 2021-12-27 (×2): 5 mg via INTRAVENOUS

## 2021-12-27 MED ORDER — ROCURONIUM BROMIDE 10 MG/ML (PF) SYRINGE
PREFILLED_SYRINGE | INTRAVENOUS | Status: DC | PRN
  Administered 2021-12-27 (×2): 50 mg via INTRAVENOUS

## 2021-12-27 MED ORDER — SUCCINYLCHOLINE CHLORIDE 200 MG/10ML IV SOSY
PREFILLED_SYRINGE | INTRAVENOUS | Status: DC | PRN
Start: 2021-12-27 — End: 2021-12-27
  Administered 2021-12-27: 140 mg via INTRAVENOUS

## 2021-12-27 MED ORDER — FENTANYL CITRATE (PF) 250 MCG/5ML IJ SOLN
INTRAMUSCULAR | Status: DC | PRN
  Administered 2021-12-27: 250 ug via INTRAVENOUS

## 2021-12-27 MED ORDER — ONDANSETRON HCL 4 MG PO TABS: 4.0000 mg | ORAL_TABLET | ORAL | Status: DC | PRN

## 2021-12-27 MED ORDER — CHLORHEXIDINE GLUCONATE CLOTH 2 % EX PADS
6.0000 | MEDICATED_PAD | Freq: Every day | CUTANEOUS | Status: DC
  Administered 2021-12-28 – 2022-01-01 (×6): 6 via TOPICAL

## 2021-12-27 MED ORDER — THROMBIN 5000 UNITS EX SOLR
OROMUCOSAL | Status: DC | PRN
  Administered 2021-12-27 (×4): 5 mL via TOPICAL

## 2021-12-27 MED ORDER — PANTOPRAZOLE SODIUM 40 MG IV SOLR
40.0000 mg | Freq: Every day | INTRAVENOUS | Status: DC
  Administered 2021-12-27 – 2021-12-29 (×3): 40 mg via INTRAVENOUS
  Filled 2021-12-27 (×4): qty 10

## 2021-12-27 SURGICAL SUPPLY — 73 items
BAG COUNTER SPONGE SURGICOUNT (BAG) ×4 IMPLANT
BAG DECANTER FOR FLEXI CONT (MISCELLANEOUS) ×2 IMPLANT
BAG SPNG CNTER NS LX DISP (BAG) ×3
BAND INSRT 18 STRL LF DISP RB (MISCELLANEOUS)
BAND RUBBER #18 3X1/16 STRL (MISCELLANEOUS) IMPLANT
BLADE CLIPPER SURG (BLADE) ×2 IMPLANT
BNDG COHESIVE 4X5 TAN STRL (GAUZE/BANDAGES/DRESSINGS) ×2 IMPLANT
BUR ACORN 6.0 PRECISION (BURR) ×2 IMPLANT
BUR SPIRAL ROUTER 2.3 (BUR) ×1 IMPLANT
CANISTER SUCT 3000ML PPV (MISCELLANEOUS) ×4 IMPLANT
CARTRIDGE OIL MAESTRO DRILL (MISCELLANEOUS) ×1 IMPLANT
CLIP VESOCCLUDE MED 6/CT (CLIP) ×2 IMPLANT
CNTNR URN SCR LID CUP LEK RST (MISCELLANEOUS) ×1 IMPLANT
CONT SPEC 4OZ STRL OR WHT (MISCELLANEOUS) ×2
COVER BURR HOLE 7 (Orthopedic Implant) ×1 IMPLANT
DIFFUSER DRILL AIR PNEUMATIC (MISCELLANEOUS) ×2 IMPLANT
DRAIN CHANNEL 10M FLAT 3/4 FLT (DRAIN) IMPLANT
DRAPE CAMERA VIDEO/LASER (DRAPES) IMPLANT
DRAPE MICROSCOPE LEICA (MISCELLANEOUS) ×1 IMPLANT
DRAPE NEUROLOGICAL W/INCISE (DRAPES) ×2 IMPLANT
DRAPE STERI IOBAN 125X83 (DRAPES) IMPLANT
DRAPE SURG 17X23 STRL (DRAPES) IMPLANT
DRAPE WARM FLUID 44X44 (DRAPES) ×2 IMPLANT
ELECT CAUTERY BLADE 6.4 (BLADE) ×2 IMPLANT
ELECT REM PT RETURN 9FT ADLT (ELECTROSURGICAL) ×2 IMPLANT
ELECTRODE REM PT RTRN 9FT ADLT (ELECTROSURGICAL) ×1 IMPLANT
EVACUATOR SILICONE 100CC (DRAIN) IMPLANT
FORCEPS BIPOLAR SPETZLER 8 1.0 (NEUROSURGERY SUPPLIES) ×1 IMPLANT
GAUZE 4X4 16PLY ~~LOC~~+RFID DBL (SPONGE) ×2 IMPLANT
GAUZE SPONGE 4X4 12PLY STRL (GAUZE/BANDAGES/DRESSINGS) ×2 IMPLANT
GLOVE EXAM NITRILE XL STR (GLOVE) IMPLANT
GLOVE SURG LTX SZ9 (GLOVE) ×2 IMPLANT
GOWN STRL REUS W/ TWL LRG LVL3 (GOWN DISPOSABLE) IMPLANT
GOWN STRL REUS W/ TWL XL LVL3 (GOWN DISPOSABLE) IMPLANT
GOWN STRL REUS W/TWL 2XL LVL3 (GOWN DISPOSABLE) IMPLANT
GOWN STRL REUS W/TWL LRG LVL3 (GOWN DISPOSABLE)
GOWN STRL REUS W/TWL XL LVL3 (GOWN DISPOSABLE)
HEMOSTAT POWDER KIT SURGIFOAM (HEMOSTASIS) ×1 IMPLANT
HEMOSTAT POWDER SURGIFOAM 1G (HEMOSTASIS) ×1 IMPLANT
HEMOSTAT SURGICEL 2X14 (HEMOSTASIS) ×2 IMPLANT
KIT BASIN OR (CUSTOM PROCEDURE TRAY) ×2 IMPLANT
KIT TURNOVER KIT B (KITS) ×2 IMPLANT
NDL HYPO 18GX1.5 BLUNT FILL (NEEDLE) IMPLANT
NDL HYPO 25X1 1.5 SAFETY (NEEDLE) ×1 IMPLANT
NEEDLE HYPO 18GX1.5 BLUNT FILL (NEEDLE) IMPLANT
NEEDLE HYPO 25X1 1.5 SAFETY (NEEDLE) ×2 IMPLANT
NS IRRIG 1000ML POUR BTL (IV SOLUTION) ×5 IMPLANT
OIL CARTRIDGE MAESTRO DRILL (MISCELLANEOUS) ×2 IMPLANT
PACK CRANIOTOMY CUSTOM (CUSTOM PROCEDURE TRAY) ×2 IMPLANT
PAD ARMBOARD 7.5X6 YLW CONV (MISCELLANEOUS) ×6 IMPLANT
PATTIES SURGICAL .25X.25 (GAUZE/BANDAGES/DRESSINGS) IMPLANT
PATTIES SURGICAL .5 X.5 (GAUZE/BANDAGES/DRESSINGS) IMPLANT
PATTIES SURGICAL .5 X3 (DISPOSABLE) IMPLANT
PATTIES SURGICAL 1X1 (DISPOSABLE) IMPLANT
PLATE CRANIAL 12 2H RIGID UNI (Plate) ×2 IMPLANT
SCREW UNIII AXS SD 1.5X4 (Screw) ×8 IMPLANT
SPONGE NEURO XRAY DETECT 1X3 (DISPOSABLE) IMPLANT
SPONGE SURGIFOAM ABS GEL 100 (HEMOSTASIS) ×6 IMPLANT
SPONGE T-LAP 4X18 ~~LOC~~+RFID (SPONGE) ×1 IMPLANT
STAPLER VISISTAT (STAPLE) IMPLANT
STAPLER VISISTAT 35W (STAPLE) ×3 IMPLANT
STOCKINETTE 6  STRL (DRAPES) ×1
STOCKINETTE 6 STRL (DRAPES) IMPLANT
SUT NURALON 4 0 TR CR/8 (SUTURE) ×5 IMPLANT
SUT VIC AB 2-0 CP2 18 (SUTURE) ×1 IMPLANT
SUT VIC AB 2-0 CT2 18 VCP726D (SUTURE) ×4 IMPLANT
SYR CONTROL 10ML LL (SYRINGE) ×2 IMPLANT
TAPE CLOTH SURG 4X10 WHT LF (GAUZE/BANDAGES/DRESSINGS) ×1 IMPLANT
TOWEL GREEN STERILE (TOWEL DISPOSABLE) ×2 IMPLANT
TOWEL GREEN STERILE FF (TOWEL DISPOSABLE) ×2 IMPLANT
TRAY FOLEY MTR SLVR 16FR STAT (SET/KITS/TRAYS/PACK) ×2 IMPLANT
UNDERPAD 30X36 HEAVY ABSORB (UNDERPADS AND DIAPERS) ×2 IMPLANT
WATER STERILE IRR 1000ML POUR (IV SOLUTION) ×2 IMPLANT

## 2021-12-27 NOTE — Progress Notes (Signed)
Patient somewhat better today but still has significant headache.  A little bit brighter and more interactive but still not greatly participating in his own care.  His sister at his bedside today.  MRI scan last night demonstrates evidence of a large ring-enhancing lesion with a very large amount of surrounding edema with marked mass effect and significant subfalcine herniation and some mild uncal herniation.  Patient is somewhat somnolent but will awaken and answer questions appropriately.  He follows commands bilaterally.  He has no evidence of weakness or overt sensory loss.  He has no cranial neuropathy at this point.  I discussed situation with patient and his sister.  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  I discussed the risks involved with surgery including but not limited to risk of anesthesia, bleeding, infection, CSF leak, brain injury including stroke, and death as well as seizure.  I explained the risk of incomplete resection and tumor recurrence and I also explained that if this is a glioblastoma this is not something that can be fully treated with surgery or other adjunctive care at least at this point.  The patient and his sister.  Understand.  They were willing to undergo surgery.

## 2021-12-27 NOTE — Anesthesia Preprocedure Evaluation (Addendum)
Anesthesia Evaluation  Patient identified by MRN, date of birth, ID band  Reviewed: Allergy & Precautions, NPO status , Patient's Chart, lab work & pertinent test results  Airway Mallampati: II  TM Distance: >3 FB     Dental   Pulmonary Current Smoker and Patient abstained from smoking.,    breath sounds clear to auscultation       Cardiovascular negative cardio ROS   Rhythm:Regular Rate:Normal     Neuro/Psych History noted Dr.Green    GI/Hepatic negative GI ROS, Neg liver ROS,   Endo/Other  negative endocrine ROS  Renal/GU negative Renal ROS     Musculoskeletal   Abdominal   Peds  Hematology   Anesthesia Other Findings   Reproductive/Obstetrics                             Anesthesia Physical Anesthesia Plan  ASA: 3  Anesthesia Plan: General   Post-op Pain Management:    Induction: Intravenous  PONV Risk Score and Plan: 2 and Ondansetron and Dexamethasone  Airway Management Planned: Oral ETT  Additional Equipment:   Intra-op Plan:   Post-operative Plan: Possible Post-op intubation/ventilation  Informed Consent: I have reviewed the patients History and Physical, chart, labs and discussed the procedure including the risks, benefits and alternatives for the proposed anesthesia with the patient or authorized representative who has indicated his/her understanding and acceptance.     Dental advisory given  Plan Discussed with: CRNA and Anesthesiologist  Anesthesia Plan Comments:         Anesthesia Quick Evaluation

## 2021-12-27 NOTE — Anesthesia Postprocedure Evaluation (Signed)
Anesthesia Post Note  Patient: Randy Gallagher  Procedure(s) Performed: CRANIOTOMY TUMOR EXCISION (Right)     Patient location during evaluation: PACU Anesthesia Type: General Level of consciousness: confused (surgeon aware) Pain management: pain level controlled Vital Signs Assessment: post-procedure vital signs reviewed and stable Respiratory status: spontaneous breathing, nonlabored ventilation, respiratory function stable and patient connected to nasal cannula oxygen Cardiovascular status: blood pressure returned to baseline and stable Postop Assessment: no apparent nausea or vomiting Anesthetic complications: no   No notable events documented.  Last Vitals:  Vitals:   12/27/21 1934 12/27/21 1949  BP: 118/79 111/73  Pulse: 82 (!) 113  Resp: 18 (!) 30  Temp:  (!) 36.1 C  SpO2: 93% 94%    Last Pain:  Vitals:   12/27/21 1521  TempSrc:   PainSc: 0-No pain                 Audry Pili

## 2021-12-27 NOTE — H&P (Addendum)
Randy Gallagher is an 51 y.o. male.   Chief Complaint: Headache HPI: 51 year old male with a 2-week history of progressive headaches.  Patient without any known history of trauma.  Denies history of malignancy.  Headaches are constant and retro-orbital worse on the right.  No history of seizure.  No history of fevers or chills.  Patient does note that over the last couple weeks he has developed a subcutaneous collection in his forehead.  This has not drained.  He denies IV drug use.  History reviewed. No pertinent past medical history.  History reviewed. No pertinent surgical history.  History reviewed. No pertinent family history. Social History:  reports that he has been smoking cigarettes. He has been smoking an average of 1 pack per day. He has never used smokeless tobacco. He reports that he does not currently use drugs. He reports that he does not drink alcohol.  Allergies: No Known Allergies  No medications prior to admission.    Results for orders placed or performed during the hospital encounter of 12/26/21 (from the past 48 hour(s))  Resp Panel by RT-PCR (Flu A&B, Covid) Nasopharyngeal Swab     Status: None   Collection Time: 12/26/21  5:09 PM   Specimen: Nasopharyngeal Swab; Nasopharyngeal(NP) swabs in vial transport medium  Result Value Ref Range   SARS Coronavirus 2 by RT PCR NEGATIVE NEGATIVE    Comment: (NOTE) SARS-CoV-2 target nucleic acids are NOT DETECTED.  The SARS-CoV-2 RNA is generally detectable in upper respiratory specimens during the acute phase of infection. The lowest concentration of SARS-CoV-2 viral copies this assay can detect is 138 copies/mL. A negative result does not preclude SARS-Cov-2 infection and should not be used as the sole basis for treatment or other patient management decisions. A negative result may occur with  improper specimen collection/handling, submission of specimen other than nasopharyngeal swab, presence of viral mutation(s) within  the areas targeted by this assay, and inadequate number of viral copies(<138 copies/mL). A negative result must be combined with clinical observations, patient history, and epidemiological information. The expected result is Negative.  Fact Sheet for Patients:  EntrepreneurPulse.com.au  Fact Sheet for Healthcare Providers:  IncredibleEmployment.be  This test is no t yet approved or cleared by the [place] FDA and  has been authorized for detection and/or diagnosis of SARS-CoV-2 by FDA under an Emergency Use Authorization (EUA). This EUA will remain  in effect (meaning this test can be used) for the duration of the COVID-19 declaration under Section 564(b)(1) of the Act, 21 U.S.C.section 360bbb-3(b)(1), unless the authorization is terminated  or revoked sooner.       Influenza A by PCR NEGATIVE NEGATIVE   Influenza B by PCR NEGATIVE NEGATIVE    Comment: (NOTE) The Xpert Xpress SARS-CoV-2/FLU/RSV plus assay is intended as an aid in the diagnosis of influenza from Nasopharyngeal swab specimens and should not be used as a sole basis for treatment. Nasal washings and aspirates are unacceptable for Xpert Xpress SARS-CoV-2/FLU/RSV testing.  Fact Sheet for Patients: EntrepreneurPulse.com.au  Fact Sheet for Healthcare Providers: IncredibleEmployment.be  This test is not yet approved or cleared by the [place] FDA and has been authorized for detection and/or diagnosis of SARS-CoV-2 by FDA under an Emergency Use Authorization (EUA). This EUA will remain in effect (meaning this test can be used) for the duration of the COVID-19 declaration under Section 564(b)(1) of the Act, 21 U.S.C. section 360bbb-3(b)(1), unless the authorization is terminated or revoked.  Performed at [facility], [address]  [place], [place] [address]   Comprehensive metabolic panel     Status: Abnormal   Collection Time:  12/26/21  7:15 PM  Result Value Ref Range   Sodium 135 135 - 145 mmol/L   Potassium 3.7 3.5 - 5.1 mmol/L   Chloride 104 98 - 111 mmol/L   CO2 20 (L) 22 - 32 mmol/L   Glucose, Bld 115 (H) 70 - 99 mg/dL    Comment: Glucose reference range applies only to samples taken after fasting for at least 8 hours.   BUN 14 6 - 20 mg/dL   Creatinine, Ser 0.60 (L) 0.61 - 1.24 mg/dL   Calcium 9.1 8.9 - 10.3 mg/dL   Total Protein 7.7 6.5 - 8.1 g/dL   Albumin 4.2 3.5 - 5.0 g/dL   AST 14 (L) 15 - 41 U/L   ALT 23 0 - 44 U/L   Alkaline Phosphatase 76 38 - 126 U/L   Total Bilirubin 0.9 0.3 - 1.2 mg/dL   GFR, Estimated >60 >60 mL/min    Comment: (NOTE) Calculated using the CKD-EPI Creatinine Equation (2021)    Anion gap 11 5 - 15    Comment: Performed at [facility], [address] [place], [place] [address]  CBC with Differential     Status: Abnormal   Collection Time: 12/26/21  7:15 PM  Result Value Ref Range   WBC 16.4 (H) 4.0 - 10.5 K/uL   RBC 5.91 (H) 4.22 - 5.81 MIL/uL   Hemoglobin 18.0 (H) 13.0 - 17.0 g/dL   HCT 55.5 (H) 39.0 - 52.0 %   MCV 93.9 80.0 - 100.0 fL   MCH 30.5 26.0 - 34.0 pg   MCHC 32.4 30.0 - 36.0 g/dL   RDW 14.6 11.5 - 15.5 %   Platelets 339 150 - 400 K/uL   nRBC 0.0 0.0 - 0.2 %   Neutrophils Relative % 81 %   Neutro Abs 13.3 (H) 1.7 - 7.7 K/uL   Lymphocytes Relative 13 %   Lymphs Abs 2.2 0.7 - 4.0 K/uL   Monocytes Relative 4 %   Monocytes Absolute 0.7 0.1 - 1.0 K/uL   Eosinophils Relative 1 %   Eosinophils Absolute 0.1 0.0 - 0.5 K/uL   Basophils Relative 1 %   Basophils Absolute 0.1 0.0 - 0.1 K/uL   Immature Granulocytes 0 %   Abs Immature Granulocytes 0.05 0.00 - 0.07 K/uL    Comment: Performed at [facility], [address] [place], [place] [address]  Lipase, blood     Status: None   Collection Time: 12/26/21  7:15 PM  Result Value Ref Range   Lipase 25 11 - 51 U/L    Comment: Performed at [facility], [address], [place], [place] [address]   CT Head Wo  Contrast  Result Date: 12/26/2021 CLINICAL DATA:  Headaches, nausea, vomiting EXAM: CT HEAD WITHOUT CONTRAST TECHNIQUE: Contiguous axial images were obtained from the base of the skull through the vertex without intravenous contrast. RADIATION DOSE REDUCTION: This exam was performed according to the departmental dose-optimization program which includes automated exposure control, adjustment of the mA and/or kV according to patient size and/or use of iterative reconstruction technique. COMPARISON:  None. FINDINGS: Brain: There is 4.4 x 3.4 cm area of focal inhomogeneous mass in the right frontotemporal region. There is marked surrounding vasogenic edema. There is extrinsic compression of right lateral ventricle. There is a proximally 10 mm shift of midline structures to the left. There is effacement of cortical sulci in  the right cerebral hemisphere. There are no epidural or subdural fluid collections. No other discrete focal space-occupying lesions are seen. Vascular: Unremarkable. Skull: No fracture is seen. There is 2.3 cm smooth marginated low-density lesion in the frontal scalp, possibly sebaceous cyst. Sinuses/Orbits: There is mucosal thickening in the ethmoid sinus. Other: None IMPRESSION: There is a large 4.4 cm inhomogeneous mass in the right temporal frontal cortex with surrounding vasogenic edema. There is mass effect with effacement of cortical sulci in the right cerebral hemisphere, extrinsic compression of right lateral ventricle and 10 mm shift of midline structures to the left. Follow-up contrast enhanced MRI and neurosurgical consultation should be considered. Imaging findings were relayed to Dr. Roderic Palau by telephone call. Electronically Signed   By: Elmer Picker M.D.   On: 12/26/2021 19:58    Pertinent items noted in HPI and remainder of comprehensive ROS otherwise negative.  Blood pressure (!) 133/93, pulse 70, temperature 98.7 F (37.1 C), temperature source Oral, resp. rate 19,  height 5\' 10"  (1.778 m), weight 87 kg, SpO2 97 %.  Patient is somnolent but will awaken and answer questions.  He has photophobia and is not particularly cooperative with the exam.  His speech is fluent.  His judgment and insight appear intact.  Cranial nerve function appears normal bilateral.  Motor 5/5 bilaterally.  No evidence of pronator drift.  Sensory examination nonfocal.  Deep tenderness is somewhat increased on the left otherwise normal.  Examination head ears eyes nose and throat demonstrates a subcutaneous fluid collection consistent with a sebaceous cyst in his mid frontal region.  Oropharynx and nasopharynx is clear.  Dentition is marginal.  Examination chest and abdomen is benign.  Extremities are free from injury deformity. Assessment/Plan Right frontotemporal brain lesion worrisome for neoplasm although abscess cannot be ruled out.  Patient needs MRI scan with and without contrast urgently tonight.  Start Decadron presumptively for now.  Patient with significant associated vasogenic edema with associated brain compression  Charlie Pitter 12/27/2021, 12:23 AM

## 2021-12-27 NOTE — Anesthesia Procedure Notes (Signed)
Arterial Line Insertion Start/End2/21/2023 4:15 PM, 12/27/2021 4:20 PM Performed by: Charyl Dancer, RN, anesthesiologist  Preanesthetic checklist: patient identified, IV checked, site marked, risks and benefits discussed, surgical consent, monitors and equipment checked, pre-op evaluation, timeout performed and anesthesia consent Lidocaine 1% used for infiltration radial was placed Catheter size: 20 G Hand hygiene performed  and maximum sterile barriers used  Allen's test indicative of satisfactory collateral circulation Attempts: 1 Procedure performed without using ultrasound guided technique. Following insertion, dressing applied and Biopatch. Post procedure assessment: normal  Patient tolerated the procedure well with no immediate complications. Additional procedure comments: A line SRNA x1.

## 2021-12-27 NOTE — Brief Op Note (Signed)
12/27/2021  6:45 PM  PATIENT:  Hulda Marin  51 y.o. male  PRE-OPERATIVE DIAGNOSIS:  brain tumor  POST-OPERATIVE DIAGNOSIS:  brain tumor  PROCEDURE:  Procedure(s) with comments: CRANIOTOMY TUMOR EXCISION (Right) - brain lab  SURGEON:  Surgeon(s) and Role:    Earnie Larsson, MD - Primary  PHYSICIAN ASSISTANT:   ASSISTANTSMearl Latin   ANESTHESIA:   general  EBL:  100   BLOOD ADMINISTERED:none  DRAINS: none   LOCAL MEDICATIONS USED:  LIDOCAINE   SPECIMEN:  Source of Specimen:  Right Temporal lobe  DISPOSITION OF SPECIMEN:  PATHOLOGY  COUNTS:  YES  TOURNIQUET:  * No tourniquets in log *  DICTATION: .Dragon Dictation  PLAN OF CARE: Admit to inpatient   PATIENT DISPOSITION:  PACU - hemodynamically stable.   Delay start of Pharmacological VTE agent (>24hrs) due to surgical blood loss or risk of bleeding: yes

## 2021-12-27 NOTE — Transfer of Care (Signed)
Immediate Anesthesia Transfer of Care Note  Patient: Randy Gallagher  Procedure(s) Performed: CRANIOTOMY TUMOR EXCISION (Right)  Patient Location: PACU  Anesthesia Type:General  Level of Consciousness: awake and confused  Airway & Oxygen Therapy: Patient Spontanous Breathing and Patient connected to face mask oxygen  Post-op Assessment: Report given to RN and Post -op Vital signs reviewed and unstable, Anesthesiologist notified  Post vital signs: Reviewed and stable  Last Vitals:  Vitals Value Taken Time  BP 141/92 12/27/21 1919  Temp    Pulse 97 12/27/21 1919  Resp 19 12/27/21 1919  SpO2 96 % 12/27/21 1919  Vitals shown include unvalidated device data.  Last Pain:  Vitals:   12/27/21 1521  TempSrc:   PainSc: 0-No pain         Complications: No notable events documented.

## 2021-12-27 NOTE — Anesthesia Procedure Notes (Signed)
Procedure Name: Intubation Date/Time: 12/27/2021 4:48 PM Performed by: Tressie Ellis, CRNA Pre-anesthesia Checklist: Patient identified, Emergency Drugs available, Suction available and Patient being monitored Patient Re-evaluated:Patient Re-evaluated prior to induction Oxygen Delivery Method: Circle system utilized Preoxygenation: Pre-oxygenation with 100% oxygen Induction Type: IV induction Ventilation: Mask ventilation without difficulty Laryngoscope Size: Miller and 2 Grade View: Grade I Tube type: Oral Number of attempts: 1 Airway Equipment and Method: Stylet Placement Confirmation: ETT inserted through vocal cords under direct vision, positive ETCO2 and breath sounds checked- equal and bilateral Tube secured with: Tape Dental Injury: Teeth and Oropharynx as per pre-operative assessment

## 2021-12-27 NOTE — Op Note (Signed)
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  Asst. Surgeon: Reinaldo Meeker, NP  Anesthesia: General  Indication: 51 year old male with 2-week history of progressively worsening headaches.  Work-up demonstrates evidence of ring-enhancing superior temporal lesion with marked surrounding vasogenic edema and mass effect with significant subfalcine and early uncal herniation.  Patient presents now for surgical debulking in hopes of improving his symptoms and establishing a diagnosis.  Operative note: After induction of anesthesia, patient position supine with his head turned toward the left and fixed in [place] pin headrest.  Patient's scalp was registered into the [place] stereotactic computer for guidance.  Scalp was prepped and draped sterilely.  A curvilinear incision is made beginning at the zygoma on the right and carrying close to midline.  Scalp flap and temporalis muscle was mobilized and reflected anteriorly and held in place.  Frontotemporoparietal craniotomy was then performed using high-speed drill.  Bone flap was elevated.  Dura was intact.  Dura was incised and the dura was flapped toward the floor of the anterior and middle fossa.  Approach through the superior temporal gyrus was made using stereotaxis and utilizing microscope for microdissection.  A small cortical incision was made in the superior temporal gyrus.  This carried down into the temporal lobe and necrotic tumor was encountered.  This was dissected free and multiple specimens were obtained.  The tumor was guided from the inside and the periphery of the tumor was  gradually resected and delivered into the resection cavity.  The tumor extended to the sylvian fissure.  The superior temporal branch was identified and protected the best that I could.  All tumor was resected back to the level of normal-appearing white matter.  Hemostasis was achieved using Surgifoam and bipolar cautery.  The wound was irrigated.  Hemostasis appeared good.  The wound was then lined with Surgicel.  Dura was reapproximated.  Gelfoam was placed over the craniectomy defect.  Bone flap was reattached using plates and screws.  Scalp was then reapproximated with 2-0 Vicryl suture the galea and staples at the surface.  There were no apparent complications.  Patient tolerated the procedure well and he returns to the recovery room postop.

## 2021-12-27 NOTE — Progress Notes (Signed)
Patient arrived from [facility] and ambulated to the restroom.  Pt was wet and clothes changed but refused peri care at this time.  Pt was connected to telemetry, vs stable, reporting no pain.  RN asked if family was notified that he was in the hospital and he said no that he would call them later himself.  Patient had stat MRI so RN notified them that he had arrived to the floor.  Pt was explained the call bell, bed in lowest position, and bed alarm set.

## 2021-12-28 ENCOUNTER — Encounter (HOSPITAL_COMMUNITY): Payer: Self-pay | Admitting: Neurosurgery

## 2021-12-28 LAB — CBC
HCT: 48.6 % (ref 39.0–52.0)
Hemoglobin: 16.6 g/dL (ref 13.0–17.0)
MCH: 30.5 pg (ref 26.0–34.0)
MCHC: 34.2 g/dL (ref 30.0–36.0)
MCV: 89.3 fL (ref 80.0–100.0)
Platelets: 359 10*3/uL (ref 150–400)
RBC: 5.44 MIL/uL (ref 4.22–5.81)
RDW: 14.2 % (ref 11.5–15.5)
WBC: 30.1 10*3/uL — ABNORMAL HIGH (ref 4.0–10.5)
nRBC: 0 % (ref 0.0–0.2)

## 2021-12-28 LAB — BASIC METABOLIC PANEL
Anion gap: 10 (ref 5–15)
BUN: 13 mg/dL (ref 6–20)
CO2: 23 mmol/L (ref 22–32)
Calcium: 8.7 mg/dL — ABNORMAL LOW (ref 8.9–10.3)
Chloride: 105 mmol/L (ref 98–111)
Creatinine, Ser: 0.6 mg/dL — ABNORMAL LOW (ref 0.61–1.24)
GFR, Estimated: >60 mL/min (ref >60)
Glucose, Bld: 123 mg/dL — ABNORMAL HIGH (ref 70–99)
Potassium: 4.4 mmol/L (ref 3.5–5.1)
Sodium: 138 mmol/L (ref 135–145)

## 2021-12-28 MED FILL — Thrombin For Soln 5000 Unit: CUTANEOUS | Qty: 5000 | Status: AC

## 2021-12-28 NOTE — Progress Notes (Signed)
Postop day 1.  No events or problems overnight.  Patient has been agitated and somewhat confused.  He is afebrile.  Blood pressure is slightly elevated otherwise vitals are normal.  Oxygenating well.  Good urine output.  He is awake and aware.  He is oriented to person and time and place but not situation.  His speech is fluent albeit somewhat flat.  Motor strength is good on both upper and lower extremities.  Wound clean and dry.  Follow-up MRI scan not performed yet.  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  Postoperative the patient is progressing about as would be expected.  He certainly improved from preop.  We will begin PT and OT.

## 2021-12-28 NOTE — Progress Notes (Signed)
°  Transition of Care [facility]) Screening Note   Patient Details  Name: Randy Gallagher Date of Birth: 04-20-1971   Transition of Care [facility]) CM/SW Contact:    Benard Halsted, LCSW Phone Number: 12/28/2021, 9:14 AM    Transition of Care Department [facility]) has reviewed patient and no TOC needs have been identified at this time. We will continue to monitor patient advancement through interdisciplinary progression rounds. If new patient transition needs arise, please place a TOC consult.

## 2021-12-29 ENCOUNTER — Inpatient Hospital Stay (HOSPITAL_COMMUNITY): Payer: BC Managed Care – PPO

## 2021-12-29 IMAGING — MR MR HEAD WO/W CM
19 of 21 series · 42 of 48 positions shown · IV contrast (gadavist)
Comparison: Brain MRI from 2 days prior [facility] in

CLINICAL DATA: Brain/CNS neoplasm, assess treatment response

EXAM:
MRI HEAD WITHOUT AND WITH CONTRAST
TECHNIQUE: Multiplanar, multiecho pulse sequences of the brain and surrounding
structures were obtained without and with intravenous contrast.
CONTRAST:  8mL GADAVIST GADOBUTROL 1 MMOL/ML IV SOLN

[Series 5: DWI · axial · 3.0mm · 0.96mm/px · z∈[-93,+78]mm · 4 of 116 slices shown (1 of 4)]
[im 1/116]
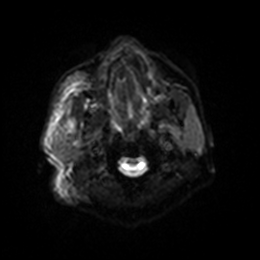
[im 39/116]
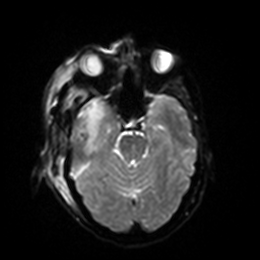
[im 77/116]
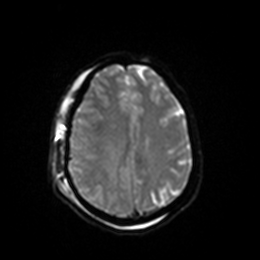
[im 116/116]
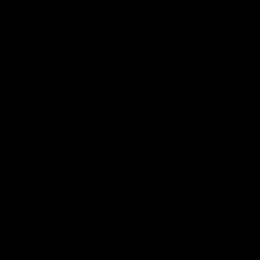

[Series 6: DWI · axial · 3.0mm · 0.96mm/px · z∈[-93,+72]mm · 2 of 56 slices shown (2 of 4)]
[im 1/56]
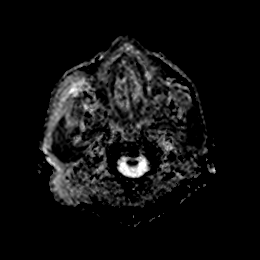
[im 56/56]
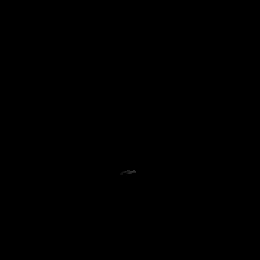

[Series 7: DWI · coronal · 4.0mm · 0.88mm/px · 4 of 82 slices shown (3 of 4)]
[im 1/82]
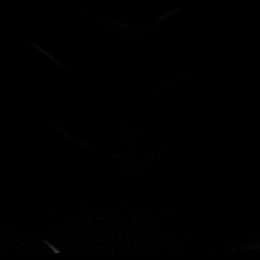
[im 28/82]
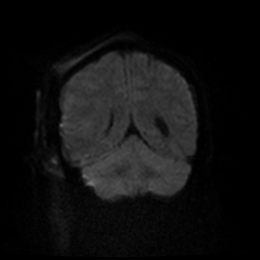
[im 55/82]
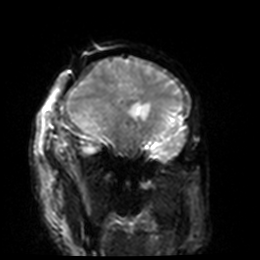
[im 82/82]
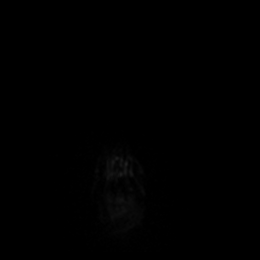

[Series 8: DWI · coronal · 4.0mm · 0.88mm/px · 2 of 41 slices shown (4 of 4)]
[im 1/41]
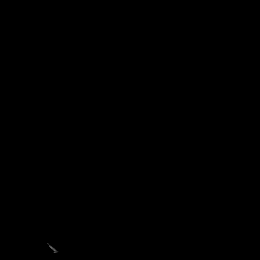
[im 41/41]
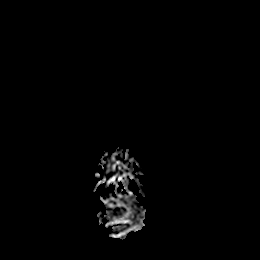

[Series 9: T2 · axial · 5.0mm · 0.78mm/px · 1 of 29 slices shown]
[im 1/29]
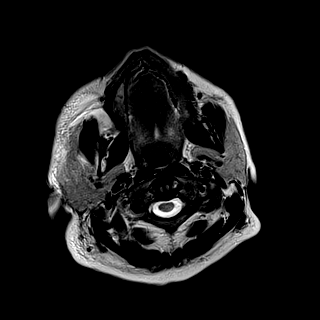

[Series 10: T1 · sagittal · 5.0mm · 0.98mm/px · 1 of 29 slices shown]
[im 1/29]
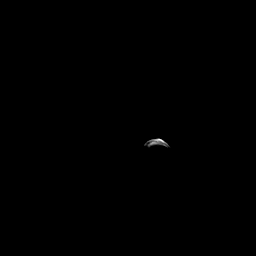

[Series 11: FLAIR · axial · 5.0mm · 0.98mm/px · 1 of 27 slices shown]
[im 1/27]
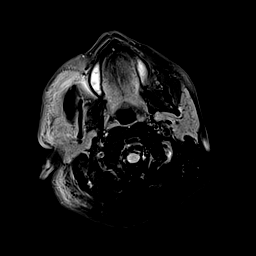

[Series 12: mag_images · axial · 3.0mm · 0.98mm/px · z∈[-97,+67]mm · 3 of 56 slices shown (1 of 2)]
[im 1/56]
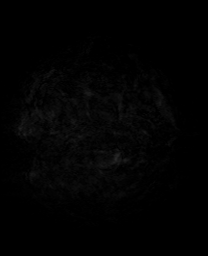
[im 28/56]
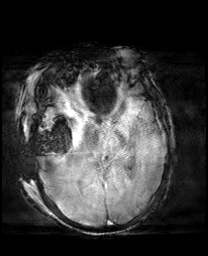
[im 56/56]
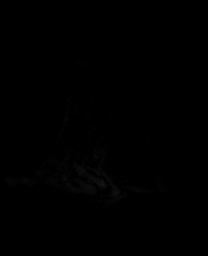

[Series 13: pha_images · axial · 3.0mm · 0.98mm/px · z∈[-94,+67]mm · 2 of 55 slices shown (1 of 2)]
[im 1/55]
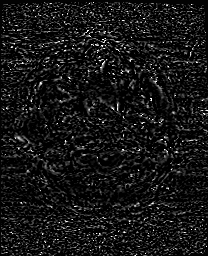
[im 55/55]
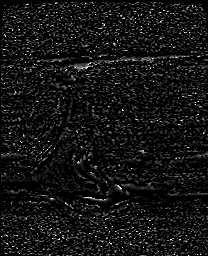

[Series 14: swi_images · axial · 3.0mm · 0.98mm/px · z∈[-97,+67]mm · 3 of 56 slices shown (1 of 2)]
[im 1/56]
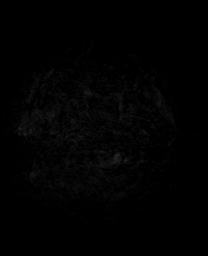
[im 28/56]
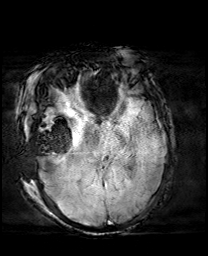
[im 56/56]
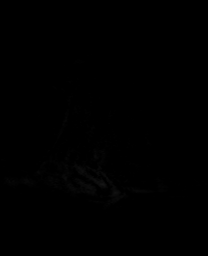

[Series 15: mip_images(sw) · axial · 24.0mm · 0.98mm/px · z∈[-87,+57]mm · 2 of 49 slices shown (1 of 2)]
[im 1/49]
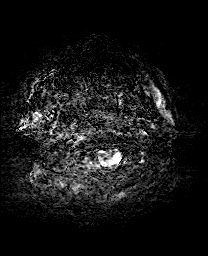
[im 49/49]
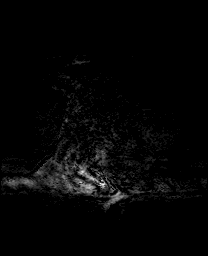

[Series 17: mag_images · axial · 3.0mm · 0.98mm/px · z∈[-97,+67]mm · 3 of 56 slices shown (2 of 2)]
[im 1/56]
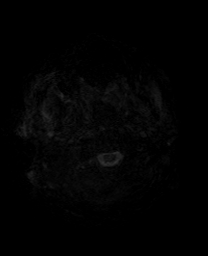
[im 28/56]
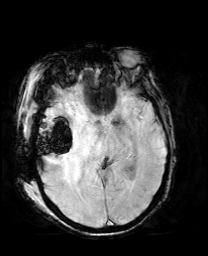
[im 56/56]
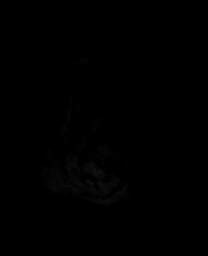

[Series 18: pha_images · axial · 3.0mm · 0.98mm/px · z∈[-97,+67]mm · 3 of 56 slices shown (2 of 2)]
[im 1/56]
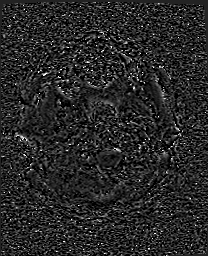
[im 28/56]
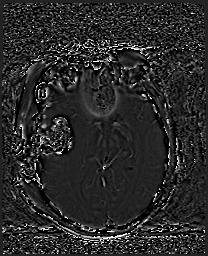
[im 56/56]
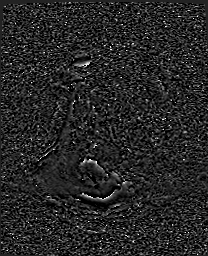

[Series 19: swi_images · axial · 3.0mm · 0.98mm/px · z∈[-97,+67]mm · 3 of 56 slices shown (2 of 2)]
[im 1/56]
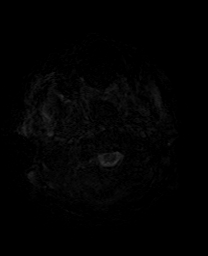
[im 28/56]
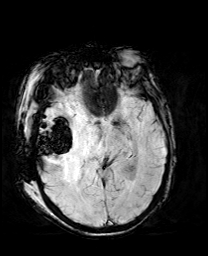
[im 56/56]
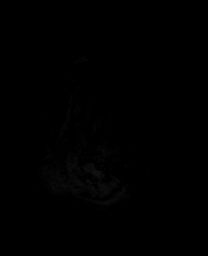

[Series 20: mip_images(sw) · axial · 24.0mm · 0.98mm/px · z∈[-87,+57]mm · 2 of 49 slices shown (2 of 2)]
[im 1/49]
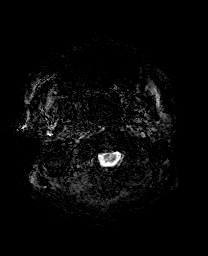
[im 49/49]
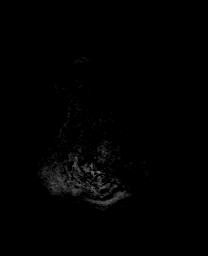

[Series 21: T2 post-contrast · coronal · 5.0mm · 0.72mm/px · 2 of 34 slices shown]
[im 1/34]
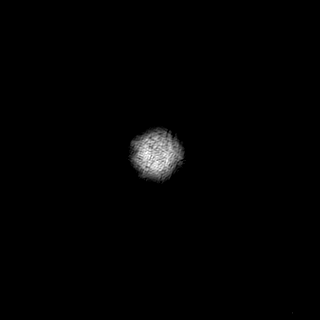
[im 34/34]
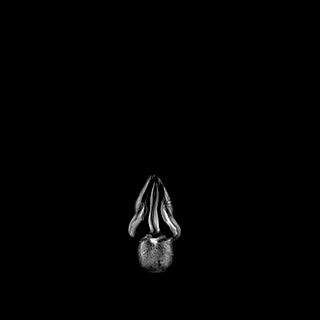

[Series 23: T1 post-contrast · coronal · 5.0mm · 0.34mm/px · 2 of 34 slices shown (1 of 3)]
[im 1/34]
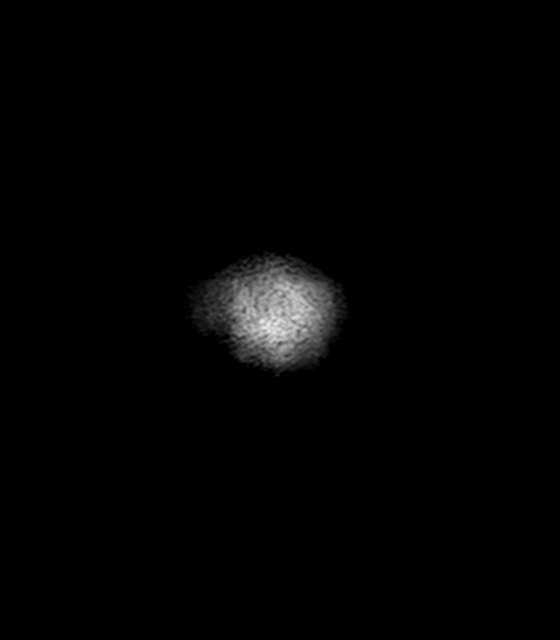
[im 34/34]
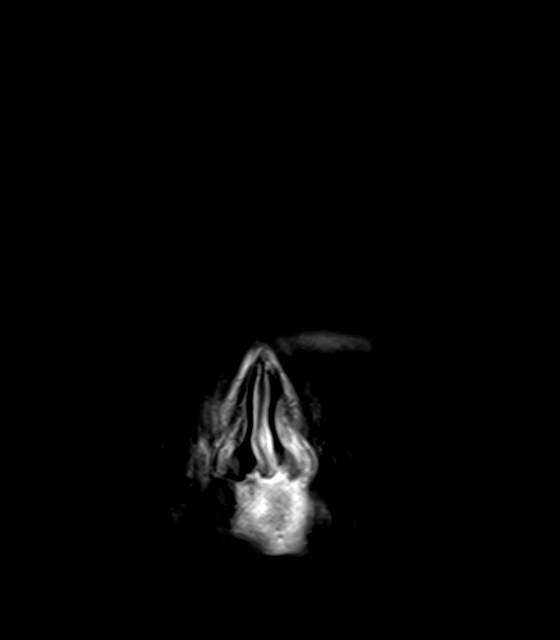

[Series 24: T1 post-contrast · sagittal · 5.0mm · 0.75mm/px · 1 of 32 slices shown (2 of 3)]
[im 1/32]
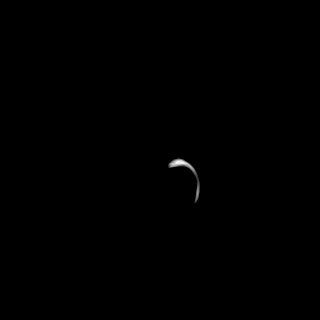

[Series 25: T1 post-contrast · sagittal · 5.0mm · 0.90mm/px · 1 of 28 slices shown (3 of 3)]
[im 1/28]
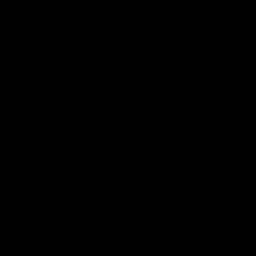

[42 of 48 positions shown; findings below may reference images not displayed]

FINDINGS: Brain: Status post debulking of right superior temporal mass with
expected blood products in place of the necrotic tumor. Peripheral
enhancement around the resection cavity, most lobulated and thickest
along the anterior superior margin. Similar degree of exuberant
vasogenic edema with midline shift measuring 9 mm. No complicating
infarct or hydrocephalus.

Vascular: Major flow voids and vascular enhancements are preserved.

Skull and upper cervical spine: Unremarkable craniotomy flap. Scalp
fluid collection beneath the staples measuring up to 1 cm in
thickness and reaching the zygoma.

Presumed dermal inclusion cyst in the anterior scalp midline.

Sinuses/Orbits: Unremarkable
IMPRESSION: 1. No complicating feature after debulking of right superior
temporal tumor. New baseline study with greatest cavity wall
enhancement anteriorly and superiorly.
2. Unchanged vasogenic edema and 9 mm of midline shift.
3. Incisional scalp collection.
4. Motion degraded.

## 2021-12-29 MED ORDER — DEXAMETHASONE 4 MG PO TABS
2.0000 mg | ORAL_TABLET | Freq: Three times a day (TID) | ORAL | Status: DC
  Administered 2021-12-29 – 2021-12-30 (×3): 2 mg via ORAL
  Filled 2021-12-29 (×3): qty 1

## 2021-12-29 MED ORDER — LEVETIRACETAM 500 MG PO TABS
500.0000 mg | ORAL_TABLET | Freq: Two times a day (BID) | ORAL | Status: DC
  Administered 2021-12-29 – 2022-01-02 (×9): 500 mg via ORAL
  Filled 2021-12-29 (×9): qty 1

## 2021-12-29 MED ORDER — GADOBUTROL 1 MMOL/ML IV SOLN
8.0000 mL | Freq: Once | INTRAVENOUS | Status: AC | PRN
  Administered 2021-12-29: 8 mL via INTRAVENOUS

## 2021-12-29 NOTE — Progress Notes (Signed)
° °  Providing Compassionate, Quality Care - Together   Subjective: Nurse reports patient has been oriented for her, but impulsive.  Objective: Vital signs in last 24 hours: Temp:  [97.2 F (36.2 C)-98.3 F (36.8 C)] 97.5 F (36.4 C) (02/23 0800) Pulse Rate:  [46-65] 51 (02/23 0800) Resp:  [10-18] 10 (02/23 0800) BP: (91-132)/(45-92) 112/75 (02/23 0800) SpO2:  [89 %-97 %] 91 % (02/23 0800)  Intake/Output from previous day: 02/22 0701 - 02/23 0700 In: 993.4 [I.V.:693.3; IV Piggyback:300.1] Out: 1525 [Urine:1525] Intake/Output this shift: Total I/O In: 75 [I.V.:75] Out: -   Responds to voice Oriented to person, place, time, but not situation PERRLA MAE, Strength and sensation intact Incision is covered with gauze; dressing is clean, dry, and intact  Lab Results: Recent Labs    12/26/21 1915 12/28/21 0553  WBC 16.4* 30.1*  HGB 18.0* 16.6  HCT 55.5* 48.6  PLT 339 359   BMET Recent Labs    12/26/21 1915 12/28/21 0553  NA 135 138  K 3.7 4.4  CL 104 105  CO2 20* 23  GLUCOSE 115* 123*  BUN 14 13  CREATININE 0.60* 0.60*  CALCIUM 9.1 8.7*    Studies/Results: MR BRAIN W WO CONTRAST  Result Date: 12/29/2021 CLINICAL DATA:  Brain/CNS neoplasm, assess treatment response EXAM: MRI HEAD WITHOUT AND WITH CONTRAST TECHNIQUE: Multiplanar, multiecho pulse sequences of the brain and surrounding structures were obtained without and with intravenous contrast. CONTRAST:  41mL GADAVIST GADOBUTROL 1 MMOL/ML IV SOLN COMPARISON:  Brain MRI from 2 days prior June in FINDINGS: Brain: Status post debulking of right superior temporal mass with expected blood products in place of the necrotic tumor. Peripheral enhancement around the resection cavity, most lobulated and thickest along the anterior superior margin. Similar degree of exuberant vasogenic edema with midline shift measuring 9 mm. No complicating infarct or hydrocephalus. Vascular: Major flow voids and vascular enhancements are  preserved. Skull and upper cervical spine: Unremarkable craniotomy flap. Scalp fluid collection beneath the staples measuring up to 1 cm in thickness and reaching the zygoma. Presumed dermal inclusion cyst in the anterior scalp midline. Sinuses/Orbits: Unremarkable IMPRESSION: 1. No complicating feature after debulking of right superior temporal tumor. New baseline study with greatest cavity wall enhancement anteriorly and superiorly. 2. Unchanged vasogenic edema and 9 mm of midline shift. 3. Incisional scalp collection. 4. Motion degraded. Electronically Signed   By: Jorje Guild M.D.   On: 12/29/2021 07:00    Assessment/Plan: Patient underwent craniotomy for tumor resection by Dr. Annette Stable on 12/27/2021. Pathology pending   LOS: 3 days   -Start weaning steroids. Switched to PO decadron today. -Switched Keppra to PO -Transfer to Progressive unit -Continue to work with therapies.  Viona Gilmore, DNP, AGNP-C Nurse Practitioner  [facility] [place] [address], [address], [place], [place] [address] P: [phone]     F: [phone]  12/29/2021, 12:02 PM

## 2021-12-29 NOTE — Evaluation (Signed)
Physical Therapy Evaluation Patient Details Name: Randy Gallagher MRN: [id number] DOB: [birth year] Today's Date: 12/29/2021  History of Present Illness  51 y.o. male presents to [facility] hospital on 12/26/2021 with 2 weeks of progressive HA. 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 Pt underwent R frontotemporoparietal craniotomy and resection of tumor on 12/27/2021. No pertinent PMH on file.  Clinical Impression  Pt presents to PT with deficits in functional mobility, gait, balance, cognition, awareness. Pt with limited verbal responses during session and poor awareness of safety and deficits. Pt attempting to slide off footrest of recliner prior to standing, all without assistance. Pt requires physical assistance to maintain balance at this time and remains at a high risk for falls due to instability and awareness deficits. Pt will benefit from continued gait and balance training in an effort to reduce falls risk.       Recommendations for follow up therapy are one component of a multi-disciplinary discharge planning process, led by the attending physician.  Recommendations may be updated based on patient status, additional functional criteria and insurance authorization.  Follow Up Recommendations Acute inpatient rehab (3hours/day)    Assistance Recommended at Discharge Frequent or constant Supervision/Assistance  Patient can return home with the following  A lot of help with walking and/or transfers;A lot of help with bathing/dressing/bathroom;Assistance with cooking/housework;Assistance with feeding;Direct supervision/assist for medications management;Direct supervision/assist for financial management;Assist for transportation;Help with stairs or ramp for entrance    Equipment Recommendations BSC/3in1 (TBD)  Recommendations for Other Services  Rehab consult    Functional Status Assessment Patient has had a recent decline in their functional status and demonstrates the ability to make  significant improvements in function in a reasonable and predictable amount of time.     Precautions / Restrictions Precautions Precautions: Fall Restrictions Weight Bearing Restrictions: No      Mobility  Bed Mobility Overal bed mobility: Needs Assistance Bed Mobility: Supine to Sit, Sit to Supine     Supine to sit: Min assist, HOB elevated Sit to supine: Min guard   General bed mobility comments: assist to pivot hips to edge of bed and initiate mobility    Transfers Overall transfer level: Needs assistance Equipment used: 1 person hand held assist Transfers: Sit to/from Stand, Bed to chair/wheelchair/BSC Sit to Stand: Min assist   Step pivot transfers: Min assist, Mod assist       General transfer comment: assist to facilitate weight shift and initiate stepping in direction of transfer    Ambulation/Gait Ambulation/Gait assistance:  (deferred 2/2 impaired cognition, safety concerns)                Stairs            Wheelchair Mobility    Modified Rankin (Stroke Patients Only)       Balance Overall balance assessment: Needs assistance Sitting-balance support: No upper extremity supported, Feet supported Sitting balance-Leahy Scale: Fair     Standing balance support: No upper extremity supported, During functional activity Standing balance-Leahy Scale: Poor Standing balance comment: minA                             Pertinent Vitals/Pain Pain Assessment Pain Assessment: CPOT Facial Expression: Tense Body Movements: Absence of movements Muscle Tension: Relaxed Compliance with ventilator (intubated pts.): N/A Vocalization (extubated pts.): N/A CPOT Total: 1    Home Living Family/patient expects to be discharged to:: Private residence Living Arrangements: Other relatives (sister) Available Help at Discharge:  Family;Available 24 hours/day Type of Home: Mobile home Home Access: Stairs to enter Entrance Stairs-Rails: Can reach  both Entrance Stairs-Number of Steps: 3   Home Layout: One level Home Equipment: Conservation officer, nature (2 wheels);Cane - single point;Crutches;Wheelchair - manual      Prior Function Prior Level of Function : Independent/Modified Independent;Working/employed;Driving                     Hand Dominance        Extremity/Trunk Assessment   Upper Extremity Assessment Upper Extremity Assessment: Difficult to assess due to impaired cognition (RUE WFL, LUE at least 4/5 shoulder and elbow strength, grip not assessed)    Lower Extremity Assessment Lower Extremity Assessment: Difficult to assess due to impaired cognition (RLE appears WFL, LLE at least 4/5 grossly)    Cervical / Trunk Assessment Cervical / Trunk Assessment: Normal  Communication   Communication: Expressive difficulties (very delayed responses, highly inconsistent)  Cognition Arousal/Alertness: Awake/alert Behavior During Therapy: Flat affect, Impulsive Overall Cognitive Status: Impaired/Different from baseline Area of Impairment: Orientation, Attention, Memory, Following commands, Safety/judgement, Awareness, Problem solving                 Orientation Level: Disoriented to, Place, Time, Situation (pt unable to provide response to orientation questions) Current Attention Level: Focused Memory: Decreased recall of precautions, Decreased short-term memory Following Commands: Follows one step commands inconsistently Safety/Judgement: Decreased awareness of safety, Decreased awareness of deficits Awareness: Intellectual Problem Solving: Slow processing, Decreased initiation, Difficulty sequencing, Requires verbal cues, Requires tactile cues          General Comments General comments (skin integrity, edema, etc.): VSS on RA, pt is impulsive, attempting to stand from recliner, sliding forward onto leg rests without lowering    Exercises     Assessment/Plan    PT Assessment Patient needs continued PT services   PT Problem List Decreased strength;Decreased activity tolerance;Decreased balance;Decreased mobility;Decreased cognition;Decreased knowledge of use of DME;Decreased safety awareness;Decreased knowledge of precautions       PT Treatment Interventions DME instruction;Gait training;Functional mobility training;Stair training;Therapeutic activities;Therapeutic exercise;Balance training;Neuromuscular re-education;Cognitive remediation;Patient/family education    PT Goals (Current goals can be found in the Care Plan section)  Acute Rehab PT Goals Patient Stated Goal: pt unable to state, PT and family goal to reutrn to independence PT Goal Formulation: With family Time For Goal Achievement: 01/12/22 Potential to Achieve Goals: Fair    Frequency Min 3X/week     Co-evaluation               AM-PAC PT "6 Clicks" Mobility  Outcome Measure Help needed turning from your back to your side while in a flat bed without using bedrails?: A Little Help needed moving from lying on your back to sitting on the side of a flat bed without using bedrails?: A Little Help needed moving to and from a bed to a chair (including a wheelchair)?: A Lot Help needed standing up from a chair using your arms (e.g., wheelchair or bedside chair)?: A Little Help needed to walk in hospital room?: Total Help needed climbing 3-5 steps with a railing? : Total 6 Click Score: 13    End of Session   Activity Tolerance: Patient tolerated treatment well Patient left: in bed;with call bell/phone within reach;with bed alarm set Nurse Communication: Mobility status PT Visit Diagnosis: Other abnormalities of gait and mobility (R26.89);Muscle weakness (generalized) (M62.81);Other symptoms and signs involving the nervous system (N46.270)    Time: 3500-9381 PT Time Calculation (min) (  ACUTE ONLY): 39 min   Charges:   PT Evaluation $PT Eval Moderate Complexity: 1 Mod PT Treatments $Therapeutic Activity: 8-22 mins         Zenaida Niece, PT, DPT Acute Rehabilitation Pager: [phone] Office [phone]   Zenaida Niece 12/29/2021, 12:09 PM

## 2021-12-29 NOTE — Evaluation (Signed)
Clinical/Bedside Swallow Evaluation Patient Details  Name: Randy Gallagher MRN: [id number] Date of Birth: Aug 19, 1971  Today's Date: 12/29/2021 Time: SLP Start Time (ACUTE ONLY): 3343 SLP Stop Time (ACUTE ONLY): 1415 SLP Time Calculation (min) (ACUTE ONLY): 21 min  Past Medical History: History reviewed. No pertinent past medical history. Past Surgical History:  Past Surgical History:  Procedure Laterality Date   CRANIOTOMY Right 12/27/2021   Procedure: CRANIOTOMY TUMOR EXCISION;  Surgeon: Randy Larsson, MD;  Location: [facility];  Service: Neurosurgery;  Laterality: Right;  brain lab   HPI:  Pt is a 51 y.o. male presented to the ED with 2 weeks of progressive headaches. 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 Pt s/p R frontotemporoparietal craniotomy and resection of tumor on 2/21. No pertinent PMH on file.    Assessment / Plan / Recommendation  Clinical Impression  Pt was seen for bedside swallow evaluation and he denied a history of dysphagia. Oral mechanism exam was limited due to pt's difficulty following commands. Pt was edentulous. He presented with symptoms of oropharyngeal dysphagia characterized by reduced bolus awareness, prolonged mastication, mild oral residue and inconsistent signs of aspiration with thin liquids. A dysphagia 3 diet with nectar thick liquids is recommended at this time. A modified barium swallow study will be conducted to further assess swallow function. SLP Visit Diagnosis: Dysphagia, unspecified (R13.10)    Aspiration Risk  Mild aspiration risk    Diet Recommendation Dysphagia 3 (Mech soft);Nectar-thick liquid   Liquid Administration via: Cup;Straw Medication Administration: Whole meds with liquid Supervision: Staff to assist with self feeding Compensations: Small sips/bites;Slow rate Postural Changes: Seated upright at 90 degrees    Other  Recommendations Oral Care Recommendations: Oral care  BID    Recommendations for follow up therapy are one component of a multi-disciplinary discharge planning process, led by the attending physician.  Recommendations may be updated based on patient status, additional functional criteria and insurance authorization.  Follow up Recommendations        Assistance Recommended at Discharge    Functional Status Assessment Patient has had a recent decline in their functional status and demonstrates the ability to make significant improvements in function in a reasonable and predictable amount of time.  Frequency and Duration min 2x/week  1 week       Prognosis Prognosis for Safe Diet Advancement: Good Barriers to Reach Goals: Cognitive deficits      Swallow Study   General Date of Onset: 12/28/21 HPI: Pt is a 51 y.o. male presented to the ED with 2 weeks of progressive headaches. 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 Pt s/p R frontotemporoparietal craniotomy and resection of tumor on 2/21. No pertinent PMH on file. Type of Study: Bedside Swallow Evaluation Previous Swallow Assessment: none Diet Prior to this Study: Regular;Thin liquids Temperature Spikes Noted: No Respiratory Status: Room air History of Recent Intubation: No Behavior/Cognition: Lethargic/Drowsy;Cooperative;Pleasant mood;Requires cueing Oral Cavity Assessment: Within Functional Limits Oral Care Completed by SLP: No Oral Cavity - Dentition: Edentulous Vision: Functional for self-feeding Self-Feeding Abilities: Needs assist Patient Positioning: Upright in bed;Postural control adequate for testing Baseline Vocal Quality: Low vocal intensity Volitional Cough: Cognitively unable to elicit Volitional Swallow: Unable to elicit    Oral/Motor/Sensory Function Overall Oral Motor/Sensory Function:  (limited)   Ice Chips Ice chips: Not tested   Thin Liquid Thin Liquid: Impaired Presentation: Straw Pharyngeal   Phase Impairments: Cough -  Immediate    Nectar Thick Nectar Thick Liquid: Within functional limits Presentation: Straw   Honey Thick Honey Thick Liquid: Not tested   Puree Puree: Impaired Presentation: Spoon Oral Phase Functional Implications: Prolonged oral transit;Oral residue   Solid     Solid: Impaired Presentation: Spoon Oral Phase Impairments: Impaired mastication     Randy Gallagher I. Randy Gallagher Randy Gallagher, Randy Gallagher, Randy Gallagher Office number [phone] Pager [facility] 12/29/2021,2:52 PM

## 2021-12-29 NOTE — Progress Notes (Signed)
Called MRI to see when I could bring pt down for follow up scan.  I was informed they had a few STAT scans, so they would have to call me back.  Will take pt down when MRI calls back.

## 2021-12-29 NOTE — Evaluation (Signed)
Occupational Therapy Evaluation Patient Details Name: Randy Gallagher MRN: [id number] DOB: 04/14/1971 Today's Date: 12/29/2021   History of Present Illness 51 y.o. male presents to [facility] hospital on 12/26/2021 with 2 weeks of progressive HA. 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 Pt underwent R frontotemporoparietal craniotomy and resection of tumor on 12/27/2021. No pertinent PMH on file.   Clinical Impression   Patient admitted for the diagnosis above.  PTA he was working full time and needed no assist with any aspect of mobility, ADL/IADL.  Patient was just returned to bed, presenting as very lethargic, and difficult to keep awake.  Deficits impacting independence are listed below.  OT to continue efforts in the acute setting with AIR recommended for post acute prior to returning home.        Recommendations for follow up therapy are one component of a multi-disciplinary discharge planning process, led by the attending physician.  Recommendations may be updated based on patient status, additional functional criteria and insurance authorization.   Follow Up Recommendations  Acute inpatient rehab (3hours/day)    Assistance Recommended at Discharge Frequent or constant Supervision/Assistance  Patient can return home with the following A lot of help with walking and/or transfers;A lot of help with bathing/dressing/bathroom;Help with stairs or ramp for entrance;Assist for transportation;Direct supervision/assist for financial management;Direct supervision/assist for medications management    Functional Status Assessment  Patient has had a recent decline in their functional status and demonstrates the ability to make significant improvements in function in a reasonable and predictable amount of time.  Equipment Recommendations  BSC/3in1;Tub/shower seat    Recommendations for Other Services       Precautions / Restrictions Precautions Precautions: Fall Restrictions Weight Bearing  Restrictions: No      Mobility Bed Mobility               General bed mobility comments: Patient just returned to bed, deferred OOB again.    Transfers                                                                     ADL either performed or assessed with clinical judgement   ADL Overall ADL's : Needs assistance/impaired                 Upper Body Dressing : Moderate assistance;Bed level   Lower Body Dressing: Maximal assistance;Bed level                       Vision   Vision Assessment?: No apparent visual deficits;Vision impaired- to be further tested in functional context Additional Comments: Swelling to R eye with brusing noted     Perception Perception Perception: Not tested   Praxis Praxis Praxis: Not tested    Pertinent Vitals/Pain Pain Assessment Pain Assessment: Faces Faces Pain Scale: No hurt Pain Intervention(s): Monitored during session     Hand Dominance Left   Extremity/Trunk Assessment Upper Extremity Assessment Upper Extremity Assessment: Difficult to assess due to impaired cognition (patient able to reach for OT's hand bilaterally against gravity, but not following commands for MMT)   Lower Extremity Assessment Lower Extremity Assessment: Difficult to assess due to impaired cognition (RLE appears WFL, LLE at least 4/5 grossly)   Cervical / Trunk  Assessment Cervical / Trunk Assessment: Normal   Communication Communication Communication: Expressive difficulties   Cognition Arousal/Alertness: Lethargic Behavior During Therapy: Flat affect Overall Cognitive Status: Impaired/Different from baseline                   Orientation Level: Disoriented to, Place, Time, Situation Current Attention Level: Focused     Safety/Judgement: Decreased awareness of safety, Decreased awareness of deficits Awareness: Intellectual Problem Solving: Slow processing, Decreased initiation, Difficulty  sequencing, Requires verbal cues, Requires tactile cues       General Comments  VSS on RA, pt is impulsive, attempting to stand from recliner, sliding forward onto leg rests without lowering    Exercises     Shoulder Instructions      Home Living Family/patient expects to be discharged to:: Private residence Living Arrangements: Other relatives Available Help at Discharge: Family;Available 24 hours/day Type of Home: Mobile home Home Access: Stairs to enter Entrance Stairs-Number of Steps: 3 Entrance Stairs-Rails: Can reach both Home Layout: One level     Bathroom Shower/Tub: Teacher, early years/pre: Standard     Home Equipment: Conservation officer, nature (2 wheels);Cane - single point;Crutches;Wheelchair - manual          Prior Functioning/Environment Prior Level of Function : Independent/Modified Independent;Working/employed;Driving                        OT Problem List: Impaired balance (sitting and/or standing);Decreased safety awareness      OT Treatment/Interventions: Self-care/ADL training;Therapeutic activities;Cognitive remediation/compensation;Patient/family education;Balance training;DME and/or AE instruction    OT Goals(Current goals can be found in the care plan section) Acute Rehab OT Goals OT Goal Formulation: Patient unable to participate in goal setting Time For Goal Achievement: 01/12/22 Potential to Achieve Goals: Fair  OT Frequency: Min 2X/week    Co-evaluation              AM-PAC OT "6 Clicks" Daily Activity     Outcome Measure Help from another person eating meals?: A Little Help from another person taking care of personal grooming?: A Little Help from another person toileting, which includes using toliet, bedpan, or urinal?: A Lot Help from another person bathing (including washing, rinsing, drying)?: A Lot Help from another person to put on and taking off regular upper body clothing?: A Lot Help from another person to put on  and taking off regular lower body clothing?: A Lot 6 Click Score: 14   End of Session    Activity Tolerance: Patient limited by lethargy Patient left: in bed;with call bell/phone within reach;with family/visitor present  OT Visit Diagnosis: Unsteadiness on feet (R26.81);Other symptoms and signs involving cognitive function                Time: 1141-1157 OT Time Calculation (min): 16 min Charges:  OT General Charges $OT Visit: 1 Visit OT Evaluation $OT Eval Moderate Complexity: 1 Mod  12/29/2021  RP, OTR/L  Acute Rehabilitation Services  Office:  [phone]   Metta Clines 12/29/2021, 2:30 PM

## 2021-12-29 NOTE — Progress Notes (Signed)
Inpatient Rehabilitation Admissions Coordinator   I met with patient at bedside who was sleeping. I spoke with his sister by phone to discuss rehab venue options. He lives with sister and her spouse and they can provide 24/7 caregiver supports. She would like to pursue [facility] admit when appropriate. I await further progress with therapy and then will begin Auth.  Danne Baxter, RN, MSN Rehab Admissions Coordinator [phone] 12/29/2021 3:37 PM

## 2021-12-29 NOTE — Progress Notes (Signed)
Patient transferred to 4NP04 with belongings. Sister called and made aware.

## 2021-12-29 NOTE — Evaluation (Signed)
Speech Language Pathology Evaluation Patient Details Name: Randy Gallagher MRN: [id number] DOB: 1971/01/01 Today's Date: 12/29/2021 Time: 7619-5093 SLP Time Calculation (min) (ACUTE ONLY): 24 min  Problem List:  Patient Active Problem List   Diagnosis Date Noted   Brain lesion 12/26/2021   Past Medical History: History reviewed. No pertinent past medical history. Past Surgical History:  Past Surgical History:  Procedure Laterality Date   CRANIOTOMY Right 12/27/2021   Procedure: CRANIOTOMY TUMOR EXCISION;  Surgeon: Earnie Larsson, MD;  Location: [facility];  Service: Neurosurgery;  Laterality: Right;  brain lab   HPI:  Pt is a 51 y.o. male presented to the ED with 2 weeks of progressive headaches. 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 Pt s/p R frontotemporoparietal craniotomy and resection of tumor on 2/21. No pertinent PMH on file.   Assessment / Plan / Recommendation Clinical Impression  Pt participated in speech-language-cognition evaluation. Pt denied any baseline deficits in these areas and stated that he works full-time as a Geophysicist/field seismologist and has some college-level education. Pt reported that his speech was less clear than baseline. Pt was lethargic throughout the evaluation and the impact of this on his performance is considered. [facility] Mental Status Examination was completed to evaluate the pt's cognitive-linguistic skills. His score was adjusted to exclude portions of the evaluation which required writing. He achieved an adjusted score of 7/24 which is below the normal limits. He exhibited difficulty in the areas of awareness, attention, memory, orientation, problem solving, and executive function. Additional processing time and repetition were required during multiple tasks. He also presented with moderate dysarthria characterized by reduced articulatory precision and reduced vocal intensity which negatively  impacted speech intelligibility and necessitated frequent requests for clarification. Skilled SLP services are clinically indicated at this time to improve motor speech and cognitive-linguistic function.    SLP Assessment  SLP Recommendation/Assessment: Patient needs continued Speech Lanaguage Pathology Services SLP Visit Diagnosis: Cognitive communication deficit (R41.841);Dysarthria and anarthria (R47.1)    Recommendations for follow up therapy are one component of a multi-disciplinary discharge planning process, led by the attending physician.  Recommendations may be updated based on patient status, additional functional criteria and insurance authorization.    Follow Up Recommendations  Acute inpatient rehab (3hours/day)    Assistance Recommended at Discharge  Frequent or constant Supervision/Assistance  Functional Status Assessment Patient has had a recent decline in their functional status and demonstrates the ability to make significant improvements in function in a reasonable and predictable amount of time.  Frequency and Duration min 2x/week  2 weeks      SLP Evaluation Cognition  Overall Cognitive Status: Impaired/Different from baseline Arousal/Alertness:  (intermittently lethargic) Orientation Level: Oriented to person;Oriented to place;Disoriented to time;Disoriented to situation Year: 2023 Month: February Day of Week: Incorrect Attention: Focused;Sustained Focused Attention: Impaired Focused Attention Impairment: Verbal complex Sustained Attention: Impaired Sustained Attention Impairment: Verbal basic Memory: Impaired Memory Impairment: Retrieval deficit;Decreased recall of new information (Immediate: 5/5 with repetition, delayed: 0/5; with cues: 0/5) Awareness: Impaired Awareness Impairment: Emergent impairment Problem Solving: Impaired Problem Solving Impairment: Verbal complex Executive Function: Sequencing;Organizing Sequencing: Impaired Sequencing Impairment:  Verbal complex Organizing: Impaired Organizing Impairment: Verbal complex (backward digit span: 0/3)       Comprehension  Auditory Comprehension Overall Auditory Comprehension: Impaired Yes/No Questions: Impaired Basic Immediate Environment Questions:  (5/5) Complex Questions:  (3/5) Commands: Impaired One Step Basic Commands:  (3/5) Two Step Basic Commands:  (1/3) Conversation: Simple  Interfering Components: Attention;Processing speed;Working Marine scientist    Expression Expression Primary Mode of Expression: Verbal Verbal Expression Initiation: No impairment Automatic Speech: Counting;Day of week (WNL) Level of Generative/Spontaneous Verbalization: Conversation Repetition: No impairment Pragmatics: Impairment Impairments: Eye contact Interfering Components: Attention Written Expression Dominant Hand: Left   Oral / Motor  Oral Motor/Sensory Function Overall Oral Motor/Sensory Function:  (limited) Motor Speech Overall Motor Speech: Impaired Respiration: Impaired Level of Impairment: Sentence Phonation: Low vocal intensity;Hoarse Resonance: Within functional limits Articulation: Impaired Level of Impairment: Sentence Intelligibility: Intelligibility reduced Word: 75-100% accurate Phrase: 75-100% accurate Sentence: 50-74% accurate Conversation: 50-74% accurate Motor Planning: Witnin functional limits Motor Speech Errors: Aware;Consistent           Adaline Trejos I. Hardin Negus, [place], [place] Office number [phone] Pager [place] 12/29/2021, 3:07 PM

## 2021-12-30 ENCOUNTER — Inpatient Hospital Stay (HOSPITAL_COMMUNITY): Payer: BC Managed Care – PPO

## 2021-12-30 MED ORDER — DEXAMETHASONE 4 MG PO TABS
2.0000 mg | ORAL_TABLET | Freq: Two times a day (BID) | ORAL | Status: DC
  Administered 2021-12-30 – 2022-01-02 (×6): 2 mg via ORAL
  Filled 2021-12-30 (×6): qty 1

## 2021-12-30 NOTE — Progress Notes (Signed)
° °  Providing Compassionate, Quality Care - Together   Subjective: Patient reports no issues overnight.  Objective: Vital signs in last 24 hours: Temp:  [97.6 F (36.4 C)-97.9 F (36.6 C)] 97.7 F (36.5 C) (02/24 0737) Pulse Rate:  [51-59] 56 (02/24 0737) Resp:  [11-16] 16 (02/24 0737) BP: (115-140)/(64-95) 132/81 (02/24 0737) SpO2:  [91 %-95 %] 94 % (02/24 0737)  Intake/Output from previous day: 02/23 0701 - 02/24 0700 In: 1376.1 [I.V.:1276.1; IV Piggyback:100] Out: 1400 [Urine:1400] Intake/Output this shift: Total I/O In: 120 [P.O.:120] Out: -   Responds to voice Oriented to person, place, time, but not situation PERRLA MAE, Strength and sensation intact Incision is covered with gauze; dressing is clean, dry, and intact  Lab Results: Recent Labs    12/28/21 0553  WBC 30.1*  HGB 16.6  HCT 48.6  PLT 359   BMET Recent Labs    12/28/21 0553  NA 138  K 4.4  CL 105  CO2 23  GLUCOSE 123*  BUN 13  CREATININE 0.60*  CALCIUM 8.7*    Studies/Results: MR BRAIN W WO CONTRAST  Result Date: 12/29/2021 CLINICAL DATA:  Brain/CNS neoplasm, assess treatment response EXAM: MRI HEAD WITHOUT AND WITH CONTRAST TECHNIQUE: Multiplanar, multiecho pulse sequences of the brain and surrounding structures were obtained without and with intravenous contrast. CONTRAST:  76mL GADAVIST GADOBUTROL 1 MMOL/ML IV SOLN COMPARISON:  Brain MRI from 2 days prior June in FINDINGS: Brain: Status post debulking of right superior temporal mass with expected blood products in place of the necrotic tumor. Peripheral enhancement around the resection cavity, most lobulated and thickest along the anterior superior margin. Similar degree of exuberant vasogenic edema with midline shift measuring 9 mm. No complicating infarct or hydrocephalus. Vascular: Major flow voids and vascular enhancements are preserved. Skull and upper cervical spine: Unremarkable craniotomy flap. Scalp fluid collection beneath the  staples measuring up to 1 cm in thickness and reaching the zygoma. Presumed dermal inclusion cyst in the anterior scalp midline. Sinuses/Orbits: Unremarkable IMPRESSION: 1. No complicating feature after debulking of right superior temporal tumor. New baseline study with greatest cavity wall enhancement anteriorly and superiorly. 2. Unchanged vasogenic edema and 9 mm of midline shift. 3. Incisional scalp collection. 4. Motion degraded. Electronically Signed   By: Jorje Guild M.D.   On: 12/29/2021 07:00    Assessment/Plan: Patient underwent craniotomy for tumor resection by Dr. Annette Stable on 12/27/2021. Pathology pending.     LOS: 4 days   -Patient being considered for CIR -Discontinued IV fluids.   Viona Gilmore, DNP, AGNP-C Nurse Practitioner  [facility] [place] [address], [place], [place], [place] [address] P: [phone]     F: [phone]  12/30/2021, 9:29 AM

## 2021-12-30 NOTE — Progress Notes (Signed)
Modified Barium Swallow Progress Note  Patient Details  Name: Randy Gallagher MRN: [id number] Date of Birth: July 24, 1971  Today's Date: 12/30/2021  Modified Barium Swallow completed.  Full report located under Chart Review in the Imaging Section.  Brief recommendations include the following:  Clinical Impression  Pt preesents with mild oropharyngeal dysphagia characterized by impaired mastication, reduced bolus cohesion, reduced lingual retraction, and an occasional pharyngeal delay. He exhibited intermittent oral residue, vallecular residue, and premature spillage. Penetration (PAS 2) was noted once with thin liquids, but no instances of aspiration were noted despite challenges of large/consecutive boluses. A dysphagia 3 diet with thin liquids is recommended at this time and SLP wil continue to follow pt.   Swallow Evaluation Recommendations       SLP Diet Recommendations: Dysphagia 3 (Mech soft) solids;Thin liquid   Liquid Administration via: Cup;Straw   Medication Administration: Whole meds with liquid (or with puree)   Supervision: Patient able to self feed   Compensations: Small sips/bites;Slow rate   Postural Changes: Seated upright at 90 degrees           Randy Gallagher I. Hardin Negus, [place], [place] Office number [phone] Pager [phone]  Horton Marshall 12/30/2021,10:54 AM

## 2021-12-30 NOTE — Progress Notes (Signed)
Mobility Specialist: Progress Note   12/30/21 1535  Mobility  Activity Ambulated independently in hallway  Level of Assistance Contact guard assist, steadying assist  Assistive Device None  Distance Ambulated (ft) 300 ft  Activity Response Tolerated well  $Mobility charge 1 Mobility   Pt received in BR and agreeable to ambulation, no c/o throughout. Pt laterally unsteady during ambulation requiring contact guard for balance, no overt LOB. Pt back to bed with call bell at his side and bed alarm on.   [facility] Randy Gallagher Mobility Specialist Mobility Specialist 5 North: [phone] Mobility Specialist 6 North: [phone]

## 2021-12-30 NOTE — Progress Notes (Signed)
Occupational Therapy Treatment Patient Details Name: Randy Gallagher MRN: [id number] DOB: 1971/02/27 Today's Date: 12/30/2021   History of present illness 51 y.o. male presents to [facility] hospital on 12/26/2021 with 2 weeks of progressive HA. 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 Pt underwent R frontotemporoparietal craniotomy and resection of tumor on 12/27/2021. No pertinent PMH on file.   OT comments  Patient much more alert this session.  Patient able to mobilize to the bathroom with Min to Mod A for balance and safety.  Lower body ADL up to Mod A for safety and balance.  Deficits impacting independence are listed below.  Patient continues to be disoriented to place and situation, he is impulsive and lacks insight to his deficits, but today was a nice progression.  OT will continue efforts, and AIR continues to be recommended given abilities and prior level of function.     Recommendations for follow up therapy are one component of a multi-disciplinary discharge planning process, led by the attending physician.  Recommendations may be updated based on patient status, additional functional criteria and insurance authorization.    Follow Up Recommendations  Acute inpatient rehab (3hours/day)    Assistance Recommended at Discharge Frequent or constant Supervision/Assistance  Patient can return home with the following  A lot of help with walking and/or transfers;A lot of help with bathing/dressing/bathroom;Help with stairs or ramp for entrance;Assist for transportation;Direct supervision/assist for financial management;Direct supervision/assist for medications management   Equipment Recommendations  Tub/shower seat    Recommendations for Other Services      Precautions / Restrictions Precautions Precautions: Fall Precaution Comments: swallowing Dys 3 diet Restrictions Weight Bearing Restrictions: No       Mobility Bed Mobility Overal bed mobility: Needs Assistance Bed  Mobility: Supine to Sit     Supine to sit: Supervision          Transfers     Transfers: Sit to/from Stand, Bed to chair/wheelchair/BSC Sit to Stand: Min assist     Step pivot transfers: Min assist, Mod assist     General transfer comment: balance assist     Balance Overall balance assessment: Needs assistance Sitting-balance support: Feet supported Sitting balance-Leahy Scale: Fair Sitting balance - Comments: decreased safety   Standing balance support: Single extremity supported Standing balance-Leahy Scale: Poor                             ADL either performed or assessed with clinical judgement   ADL   Eating/Feeding: Set up;Sitting Eating/Feeding Details (indicate cue type and reason): using his fingers at this point Grooming: Wash/dry hands;Min guard;Standing           Upper Body Dressing : Minimal assistance;Sitting   Lower Body Dressing: Moderate assistance;Sit to/from stand   Toilet Transfer: Minimal assistance;Ambulation;Regular Toilet   Toileting- Clothing Manipulation and Hygiene: Minimal assistance;Sit to/from stand       Functional mobility during ADLs: Minimal assistance;Moderate assistance General ADL Comments: unsteady, impulsive and running into objects    Extremity/Trunk Assessment Upper Extremity Assessment Upper Extremity Assessment: Overall WFL for tasks assessed       Cervical / Trunk Assessment Cervical / Trunk Assessment: Normal    Vision   Vision Assessment?: No apparent visual deficits   Perception Perception Perception: Within Functional Limits   Praxis Praxis Praxis: Intact    Cognition Arousal/Alertness: Awake/alert Behavior During Therapy: Impulsive Overall Cognitive Status: Impaired/Different from baseline  Orientation Level: Disoriented to, Place, Time, Situation Current Attention Level: Sustained Memory: Decreased recall of precautions, Decreased short-term  memory Following Commands: Follows one step commands with increased time, Follows multi-step commands consistently Safety/Judgement: Decreased awareness of safety, Decreased awareness of deficits Awareness: Intellectual Problem Solving: Slow processing, Decreased initiation, Difficulty sequencing, Requires verbal cues, Requires tactile cues          Exercises      Shoulder Instructions       General Comments      Pertinent Vitals/ Pain       Pain Assessment Pain Assessment: No/denies pain Pain Intervention(s): Monitored during session                                                          Frequency           Progress Toward Goals  OT Goals(current goals can now be found in the care plan section)  Progress towards OT goals: Progressing toward goals  Acute Rehab OT Goals OT Goal Formulation: Patient unable to participate in goal setting Time For Goal Achievement: 01/12/22 Potential to Achieve Goals: [place] Discharge plan remains appropriate    Co-evaluation                 AM-PAC OT "6 Clicks" Daily Activity     Outcome Measure     Help from another person taking care of personal grooming?: A Little Help from another person toileting, which includes using toliet, bedpan, or urinal?: A Little Help from another person bathing (including washing, rinsing, drying)?: A Lot Help from another person to put on and taking off regular upper body clothing?: A Little Help from another person to put on and taking off regular lower body clothing?: A Lot 6 Click Score: 13    End of Session Equipment Utilized During Treatment: Gait belt  OT Visit Diagnosis: Unsteadiness on feet (R26.81);Other symptoms and signs involving cognitive function   Activity Tolerance Patient tolerated treatment well   Patient Left in chair;with call bell/phone within reach;with chair alarm set;with family/visitor present   Nurse Communication Mobility  status        Time: 7867-5449 OT Time Calculation (min): 25 min  Charges: OT General Charges $OT Visit: 1 Visit OT Treatments $Self Care/Home Management : 23-37 mins  12/30/2021  RP, OTR/L  Acute Rehabilitation Services  Office:  [phone]   Metta Clines 12/30/2021, 8:35 AM

## 2021-12-30 NOTE — Progress Notes (Signed)
Physical Therapy Treatment Patient Details Name: Randy Gallagher MRN: [id number] DOB: 07/16/1971 Today's Date: 12/30/2021   History of Present Illness 51 y.o. male presents to [facility] hospital on 12/26/2021 with 2 weeks of progressive HA. 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 Pt underwent R frontotemporoparietal craniotomy and resection of tumor on 12/27/2021. No pertinent PMH on file.    PT Comments    Pt tolerates treatment well with some improvement noted in processing time and awareness of deficits. Pt experiences multiple lateral losses of balance during session and requires UE support of railing or PT assistance to prevent multiple potential falls. Pt continues to have difficulty following multi-step commands and is at a high risk for falls due to instability and impaired cognition. Pt will benefit from continued gait and balance training in an effort to restore independence.    Recommendations for follow up therapy are one component of a multi-disciplinary discharge planning process, led by the attending physician.  Recommendations may be updated based on patient status, additional functional criteria and insurance authorization.  Follow Up Recommendations  Acute inpatient rehab (3hours/day)     Assistance Recommended at Discharge Frequent or constant Supervision/Assistance  Patient can return home with the following A lot of help with walking and/or transfers;A lot of help with bathing/dressing/bathroom;Assistance with cooking/housework;Assistance with feeding;Direct supervision/assist for medications management;Direct supervision/assist for financial management;Assist for transportation;Help with stairs or ramp for entrance   Equipment Recommendations  BSC/3in1    Recommendations for Other Services       Precautions / Restrictions Precautions Precautions: Fall Precaution Comments: swallowing Dys 3 diet Restrictions Weight Bearing Restrictions: No     Mobility  Bed  Mobility Overal bed mobility: Modified Independent Bed Mobility: Supine to Sit     Supine to sit: Modified independent (Device/Increase time)          Transfers Overall transfer level: Needs assistance Equipment used: None Transfers: Sit to/from Stand Sit to Stand: Min assist           General transfer comment: lateral loss of balance with initial sit to stand    Ambulation/Gait Ambulation/Gait assistance: Min assist, Mod assist Gait Distance (Feet): 250 Feet Assistive device:  (PRN support of wall or railing) Gait Pattern/deviations: Drifts right/left, Staggering right, Staggering left Gait velocity: reduced Gait velocity interpretation: <1.8 ft/sec, indicate of risk for recurrent falls   General Gait Details: pt with multiple lateral losses of balance, drifting toward wall or railing for support. Pt needs modA to correct 2-3 losses of balance during session   Stairs             Wheelchair Mobility    Modified Rankin (Stroke Patients Only)       Balance Overall balance assessment: Needs assistance Sitting-balance support: No upper extremity supported, Feet supported Sitting balance-Leahy Scale: Fair     Standing balance support: No upper extremity supported, During functional activity Standing balance-Leahy Scale: Poor Standing balance comment: minA                            Cognition Arousal/Alertness: Awake/alert Behavior During Therapy: Impulsive Overall Cognitive Status: Impaired/Different from baseline Area of Impairment: Orientation, Attention, Memory, Following commands, Safety/judgement, Awareness, Problem solving                 Orientation Level:  (pt does not answer orientation questions for situation) Current Attention Level: Sustained Memory: Decreased recall of precautions, Decreased short-term memory Following Commands: Follows one step commands  with increased time, Follows multi-step commands  inconsistently Safety/Judgement: Decreased awareness of safety, Decreased awareness of deficits Awareness: Emergent Problem Solving: Slow processing, Difficulty sequencing, Requires verbal cues          Exercises      General Comments General comments (skin integrity, edema, etc.): pt with impaired coordination when urinating at commode, urinating onto gown      Pertinent Vitals/Pain Pain Assessment Pain Assessment: No/denies pain (does often reach to touch bandages at incision site)    Home Living                          Prior Function            PT Goals (current goals can now be found in the care plan section) Acute Rehab PT Goals Patient Stated Goal: pt unable to state, PT and family goal to reutrn to independence Progress towards PT goals: Progressing toward goals    Frequency    Min 3X/week      PT Plan Current plan remains appropriate    Co-evaluation              AM-PAC PT "6 Clicks" Mobility   Outcome Measure  Help needed turning from your back to your side while in a flat bed without using bedrails?: None Help needed moving from lying on your back to sitting on the side of a flat bed without using bedrails?: None Help needed moving to and from a bed to a chair (including a wheelchair)?: A Little Help needed standing up from a chair using your arms (e.g., wheelchair or bedside chair)?: A Little Help needed to walk in hospital room?: A Lot Help needed climbing 3-5 steps with a railing? : Total 6 Click Score: 17    End of Session   Activity Tolerance: Patient tolerated treatment well Patient left: in chair;with call bell/phone within reach;with chair alarm set Nurse Communication: Mobility status PT Visit Diagnosis: Other abnormalities of gait and mobility (R26.89);Muscle weakness (generalized) (M62.81);Other symptoms and signs involving the nervous system (R29.898)     Time: 0175-1025 PT Time Calculation (min) (ACUTE ONLY): 17  min  Charges:  $Gait Training: 8-22 mins                     Zenaida Niece, PT, DPT Acute Rehabilitation Pager: [phone] Office [place] An[place] Caya 12/30/2021, 12:25 PM

## 2021-12-31 MED ORDER — PANTOPRAZOLE SODIUM 40 MG PO TBEC
40.0000 mg | DELAYED_RELEASE_TABLET | Freq: Every day | ORAL | Status: DC
  Administered 2021-12-31 – 2022-01-01 (×2): 40 mg via ORAL
  Filled 2021-12-31 (×2): qty 1

## 2021-12-31 NOTE — Progress Notes (Signed)
°  NEUROSURGERY PROGRESS NOTE   No issues overnight. Reports mild but improving HA. No new complaints.  EXAM:  BP 107/74    Pulse 68    Temp 97.8 F (36.6 C) (Oral)    Resp 14    Ht 5\' 10"  (1.778 m)    Wt 87 kg    SpO2 93%    BMI 27.52 kg/m   Awake, alert Speech fluent, appropriate  CN grossly intact  MAE well Dressing c/d/i   IMPRESSION:  51 y.o. male POD# 4 right crani for tumor. Neurologically stable  PLAN: - Cont supportive care - Dispo planning - suspect with need CIR   Randy Lose, MD [facility]

## 2022-01-01 MED ORDER — NICOTINE 21 MG/24HR TD PT24
21.0000 mg | MEDICATED_PATCH | Freq: Every day | TRANSDERMAL | Status: DC
  Administered 2022-01-01 – 2022-01-02 (×2): 21 mg via TRANSDERMAL
  Filled 2022-01-01 (×2): qty 1

## 2022-01-01 NOTE — Progress Notes (Signed)
°  NEUROSURGERY PROGRESS NOTE   No issues overnight. Unchanged mild HA.  EXAM:  BP 113/78 (BP Location: Left Arm)    Pulse (!) 55    Temp 98.1 F (36.7 C) (Oral)    Resp 14    Ht 5\' 10"  (1.778 m)    Wt 87 kg    SpO2 91%    BMI 27.52 kg/m   Awake, alert Speech fluent, appropriate  CN grossly intact  MAE well Dressing c/d/i   IMPRESSION:  51 y.o. male POD# 5 right crani for tumor. Neurologically stable  PLAN: - Cont supportive care - Likely CIR candidate   Consuella Lose, MD [facility]

## 2022-01-02 ENCOUNTER — Other Ambulatory Visit: Payer: Self-pay | Admitting: Radiation Therapy

## 2022-01-02 ENCOUNTER — Other Ambulatory Visit: Payer: Self-pay

## 2022-01-02 ENCOUNTER — Encounter (HOSPITAL_COMMUNITY): Payer: Self-pay | Admitting: Physical Medicine and Rehabilitation

## 2022-01-02 ENCOUNTER — Inpatient Hospital Stay (HOSPITAL_COMMUNITY)
Admission: RE | Admit: 2022-01-02 | Discharge: 2022-01-05 | DRG: 945 | Disposition: A | Discharge status: Home Health | Payer: BC Managed Care – PPO | Source: Intra-hospital | Attending: Physical Medicine and Rehabilitation | Admitting: Physical Medicine and Rehabilitation

## 2022-01-02 DIAGNOSIS — R5381 Other malaise: Principal | ICD-10-CM | POA: Diagnosis present

## 2022-01-02 DIAGNOSIS — R739 Hyperglycemia, unspecified: Secondary | ICD-10-CM | POA: Diagnosis present

## 2022-01-02 DIAGNOSIS — F17210 Nicotine dependence, cigarettes, uncomplicated: Secondary | ICD-10-CM | POA: Diagnosis not present

## 2022-01-02 DIAGNOSIS — K5900 Constipation, unspecified: Secondary | ICD-10-CM | POA: Diagnosis not present

## 2022-01-02 DIAGNOSIS — R269 Unspecified abnormalities of gait and mobility: Secondary | ICD-10-CM | POA: Diagnosis present

## 2022-01-02 DIAGNOSIS — E669 Obesity, unspecified: Secondary | ICD-10-CM | POA: Diagnosis present

## 2022-01-02 DIAGNOSIS — G936 Cerebral edema: Secondary | ICD-10-CM | POA: Diagnosis not present

## 2022-01-02 DIAGNOSIS — Z515 Encounter for palliative care: Secondary | ICD-10-CM | POA: Diagnosis not present

## 2022-01-02 DIAGNOSIS — G939 Disorder of brain, unspecified: Secondary | ICD-10-CM | POA: Diagnosis present

## 2022-01-02 DIAGNOSIS — R4189 Other symptoms and signs involving cognitive functions and awareness: Secondary | ICD-10-CM | POA: Diagnosis not present

## 2022-01-02 DIAGNOSIS — D496 Neoplasm of unspecified behavior of brain: Secondary | ICD-10-CM | POA: Diagnosis present

## 2022-01-02 DIAGNOSIS — E8809 Other disorders of plasma-protein metabolism, not elsewhere classified: Secondary | ICD-10-CM | POA: Diagnosis present

## 2022-01-02 DIAGNOSIS — C719 Malignant neoplasm of brain, unspecified: Secondary | ICD-10-CM | POA: Diagnosis not present

## 2022-01-02 DIAGNOSIS — Z6825 Body mass index (BMI) 25.0-25.9, adult: Secondary | ICD-10-CM

## 2022-01-02 DIAGNOSIS — Z7189 Other specified counseling: Secondary | ICD-10-CM | POA: Diagnosis not present

## 2022-01-02 MED ORDER — LEVETIRACETAM 500 MG PO TABS: 500.0000 mg | ORAL_TABLET | Freq: Two times a day (BID) | ORAL | Status: DC

## 2022-01-02 MED ORDER — ACETAMINOPHEN 650 MG RE SUPP: 650.0000 mg | Freq: Four times a day (QID) | RECTAL | Status: DC | PRN

## 2022-01-02 MED ORDER — NICOTINE 21 MG/24HR TD PT24: 21.0000 mg | MEDICATED_PATCH | Freq: Every day | TRANSDERMAL | 0 refills | Status: DC

## 2022-01-02 MED ORDER — DEXAMETHASONE 2 MG PO TABS: 2.0000 mg | ORAL_TABLET | Freq: Two times a day (BID) | ORAL | Status: DC

## 2022-01-02 MED ORDER — DOCUSATE SODIUM 100 MG PO CAPS: 100.0000 mg | ORAL_CAPSULE | Freq: Two times a day (BID) | ORAL | 0 refills | Status: DC

## 2022-01-02 MED ORDER — POLYETHYLENE GLYCOL 3350 17 G PO PACK: 17.0000 g | PACK | Freq: Every day | ORAL | Status: DC | PRN

## 2022-01-02 MED ORDER — PANTOPRAZOLE SODIUM 40 MG PO TBEC
40.0000 mg | DELAYED_RELEASE_TABLET | Freq: Every day | ORAL | Status: DC
  Administered 2022-01-02 – 2022-01-04 (×3): 40 mg via ORAL
  Filled 2022-01-02 (×3): qty 1

## 2022-01-02 MED ORDER — ONDANSETRON HCL 4 MG PO TABS: 4.0000 mg | ORAL_TABLET | ORAL | Status: DC | PRN

## 2022-01-02 MED ORDER — ACETAMINOPHEN 325 MG PO TABS
650.0000 mg | ORAL_TABLET | Freq: Four times a day (QID) | ORAL | Status: DC | PRN
Start: 2022-01-02 — End: 2022-01-05

## 2022-01-02 MED ORDER — LEVETIRACETAM 500 MG PO TABS
500.0000 mg | ORAL_TABLET | Freq: Two times a day (BID) | ORAL | Status: DC
  Administered 2022-01-02 – 2022-01-04 (×4): 500 mg via ORAL
  Filled 2022-01-02 (×4): qty 1

## 2022-01-02 MED ORDER — HYDROCODONE-ACETAMINOPHEN 5-325 MG PO TABS: 1.0000 | ORAL_TABLET | ORAL | Status: DC | PRN

## 2022-01-02 MED ORDER — ONDANSETRON HCL 4 MG/2ML IJ SOLN: 4.0000 mg | INTRAMUSCULAR | Status: DC | PRN

## 2022-01-02 MED ORDER — DOCUSATE SODIUM 100 MG PO CAPS
100.0000 mg | ORAL_CAPSULE | Freq: Two times a day (BID) | ORAL | Status: DC
  Administered 2022-01-03 – 2022-01-04 (×3): 100 mg via ORAL
  Filled 2022-01-02 (×5): qty 1

## 2022-01-02 MED ORDER — DEXAMETHASONE 2 MG PO TABS
2.0000 mg | ORAL_TABLET | Freq: Two times a day (BID) | ORAL | Status: DC
  Administered 2022-01-02 – 2022-01-05 (×6): 2 mg via ORAL
  Filled 2022-01-02 (×6): qty 1

## 2022-01-02 MED ORDER — BISACODYL 10 MG RE SUPP: 10.0000 mg | Freq: Every day | RECTAL | Status: DC | PRN

## 2022-01-02 MED ORDER — METHOCARBAMOL 1000 MG/10ML IJ SOLN: 500.0000 mg | Freq: Four times a day (QID) | INTRAVENOUS | Status: DC | PRN

## 2022-01-02 MED ORDER — NICOTINE 21 MG/24HR TD PT24
21.0000 mg | MEDICATED_PATCH | Freq: Every day | TRANSDERMAL | Status: DC
  Filled 2022-01-02 (×2): qty 1

## 2022-01-02 MED ORDER — HYDROCODONE-ACETAMINOPHEN 5-325 MG PO TABS: 1.0000 | ORAL_TABLET | ORAL | 0 refills | Status: DC | PRN

## 2022-01-02 NOTE — Progress Notes (Signed)
Patient admitted to unit via wheel from 4N. Patient oriented to floor and made aware of plan. Bed in lowest position and call bell within reach.

## 2022-01-02 NOTE — Progress Notes (Signed)
Inpatient Rehabilitation Admissions Coordinator    I have insurance approval and CIR bed to admit him to today. I contacted his sister by phone and met with patient who is in agreement., Dr Annette Stable, acute team and [facility] made aware. I will make the arrangements to admit today.  Danne Baxter, RN, MSN Rehab Admissions Coordinator [phone] 01/02/2022 11:07 AM

## 2022-01-02 NOTE — H&P (Signed)
Physical Medicine and Rehabilitation Admission H&P        Chief Complaint  Patient presents with   Emesis  : HPI: Randy Gallagher is a 51 year old right-handed male with history of tobacco use on no prescription medications.  Per chart review lives with his sister.  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  1 level home 3 steps to entry.  Presented 12/26/2021 with progressive headaches retro-orbital worse on the right.  Patient had noted over the last couple of weeks developed a subcutaneous collection in his forehead.  Denied any nausea vomiting or seizure.  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  T2 hyperintense swelling local mass effect and a 1 cm midline shift.  Patient underwent right frontal temporal parietal craniotomy resection of tumor utilizing intraoperative stereotaxis and microdissection 12/27/2021 per Dr. Annette Stable.  MRI follow-up showed no complicating feature after debulking of right superior temporal tumor.  Unchanged vasogenic edema and 9 mm of midline shift.  Maintained on Decadron with slow taper.  Keppra 500 mg twice daily for seizure prophylaxis.  Tolerating a mechanical soft diet.  Therapy evaluations completed due to patient decreased functional mobility was admitted for a comprehensive rehab program.   Review of Systems  Constitutional:  Negative for chills and fever.  HENT:  Negative for hearing loss.   Eyes:  Negative for blurred vision and double vision.  Respiratory:  Negative for cough and shortness of breath.   Cardiovascular:  Negative for chest pain and palpitations.  Gastrointestinal:  Positive for constipation. Negative for heartburn, nausea and vomiting.  Genitourinary:  Negative for dysuria, flank pain and hematuria.  Musculoskeletal:  Positive for myalgias.  Skin:  Negative for rash.  Neurological:  Positive for dizziness, weakness and headaches.  All other  systems reviewed and are negative. History reviewed. No pertinent past medical history.      Past Surgical History:  Procedure Laterality Date   CRANIOTOMY Right 12/27/2021    Procedure: CRANIOTOMY TUMOR EXCISION;  Surgeon: Earnie Larsson, MD;  Location: [place];  Service: Neurosurgery;  Laterality: Right;  brain lab    History reviewed. No pertinent family history. Social History:  reports that he has been smoking cigarettes. He has been smoking an average of 1 pack per day. He has never used smokeless tobacco. He reports that he does not currently use drugs. He reports that he does not drink alcohol. Allergies: No Known Allergies No medications prior to admission.          Home: Home Living Family/patient expects to be discharged to:: Private residence Living Arrangements: Other relatives Available Help at Discharge: Family, Available 24 hours/day Type of Home: Mobile home Home Access: Stairs to enter [employer] of Steps: 3 Entrance Stairs-Rails: Can reach both Home Layout: One level Bathroom Shower/Tub: Chiropodist: [facility] Home Equipment: Conservation officer, nature (2 wheels), [employer] - single point, Health visitor, Wheelchair - manual  Lives With: Family   Functional History: Prior Function Prior Level of Function : Independent/Modified Independent, Working/employed, Astronomer Status:  Mobility: Bed Mobility Overal bed mobility: Modified Independent Bed Mobility: Supine to Sit Supine to sit: Modified independent (Device/Increase time) Sit to supine: Min guard General bed mobility comments: Patient just returned to bed, deferred OOB again. Transfers Overall transfer level: Needs assistance Equipment used: None Transfers: Sit to/from Stand Sit to Stand: Min assist Bed to/from chair/wheelchair/BSC transfer type:: Step pivot Step pivot transfers: Min assist, Mod assist  General transfer comment: lateral loss of balance with initial sit to  stand Ambulation/Gait Ambulation/Gait assistance: Min assist, Mod assist Gait Distance (Feet): 250 Feet Assistive device:  (PRN support of wall or railing) Gait Pattern/deviations: Drifts right/left, Staggering right, Staggering left General Gait Details: pt with multiple lateral losses of balance, drifting toward wall or railing for support. Pt needs modA to correct 2-3 losses of balance during session Gait velocity: reduced Gait velocity interpretation: <1.8 ft/sec, indicate of risk for recurrent falls   ADL: ADL Overall ADL's : Needs assistance/impaired Eating/Feeding: Set up, Sitting Eating/Feeding Details (indicate cue type and reason): using his fingers at this point Grooming: Wash/dry hands, Min guard, Standing Upper Body Dressing : Minimal assistance, Sitting Lower Body Dressing: Moderate assistance, Sit to/from stand Toilet Transfer: Minimal assistance, Ambulation, Regular Toilet Toileting- Clothing Manipulation and Hygiene: Minimal assistance, Sit to/from stand Functional mobility during ADLs: Minimal assistance, Moderate assistance General ADL Comments: unsteady, impulsive and running into objects   Cognition: Cognition Overall Cognitive Status: Impaired/Different from baseline Arousal/Alertness:  (intermittently lethargic) Orientation Level: Oriented X4 Year: 2023 Month: February Day of Week: Incorrect Attention: Focused, Sustained Focused Attention: Impaired Focused Attention Impairment: Verbal complex Sustained Attention: Impaired Sustained Attention Impairment: Verbal basic Memory: Impaired Memory Impairment: Retrieval deficit, Decreased recall of new information (Immediate: 5/5 with repetition, delayed: 0/5; with cues: 0/5) Awareness: Impaired Awareness Impairment: Emergent impairment Problem Solving: Impaired Problem Solving Impairment: Verbal complex Executive Function: Sequencing, Technical brewer: Impaired Sequencing Impairment: Verbal  complex Organizing: Impaired Organizing Impairment: Verbal complex (backward digit span: 0/3) Cognition Arousal/Alertness: Awake/alert Behavior During Therapy: Impulsive Overall Cognitive Status: Impaired/Different from baseline Area of Impairment: Orientation, Attention, Memory, Following commands, Safety/judgement, Awareness, Problem solving Orientation Level:  (pt does not answer orientation questions for situation) Current Attention Level: Sustained Memory: Decreased recall of precautions, Decreased short-term memory Following Commands: Follows one step commands with increased time, Follows multi-step commands inconsistently Safety/Judgement: Decreased awareness of safety, Decreased awareness of deficits Awareness: Emergent Problem Solving: Slow processing, Difficulty sequencing, Requires verbal cues   Physical Exam: Blood pressure 116/71, pulse 84, temperature 98.2 F (36.8 C), temperature source Oral, resp. rate 18, height 5\' 10"  (1.778 m), weight 87 kg, SpO2 97 %. Physical Exam HENT:     Head:     Comments: Craniotomy site clean and dry.  Subcutaneous fluid collection consistent with subcutaneous cyst mid frontal region Neurological:     Comments: Patient is alert.  Makes eye contact with examiner.  Mood is a bit flat.  Provides name age and date of birth with some mild delay in processing.    General: No acute distress Mood and affect are appropriate Heart: Regular rate and rhythm no rubs murmurs or extra sounds Lungs: Clear to auscultation, breathing unlabored, no rales or wheezes Abdomen: Positive bowel sounds, soft nontender to palpation, nondistended Extremities: No clubbing, cyanosis, or edema Skin: No evidence of breakdown, no evidence of rash Neurologic: Cranial nerves II through XII intact, motor strength is 5/5 in bilateral deltoid, bicep, tricep, grip, hip flexor, knee extensors, ankle dorsiflexor and plantar flexor Sensory exam normal sensation to light touch and  proprioception in bilateral upper and lower extremities  Musculoskeletal: Full range of motion in all 4 extremities. No joint swelling    Lab Results Last 48 Hours  No results found for this or any previous visit (from the past 48 hour(s)).   Imaging Results (Last 48 hours)  No results found.         Blood pressure 116/71, pulse 84,  temperature 98.2 F (36.8 C), temperature source Oral, resp. rate 18, height 5\' 10"  (1.778 m), weight 87 kg, SpO2 97 %.   Medical Problem List and Plan: 1. 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  Status post right frontal temporoparietal craniotomy resection of tumor 12/27/2021.  Follow-up per neurosurgery.  Slow taper of Decadron.             -patient may  shower with shower cap             -ELOS/Goals: 7-9 d 2.  Antithrombotics: -DVT/anticoagulation:  Mechanical: Antiembolism stockings, thigh (TED hose) Bilateral lower extremities.               -antiplatelet therapy: N/A 3. Pain Management: Hydrocodone as needed. 4. Mood: Provide emotional support             -antipsychotic agents: N/A 5. Neuropsych: This patient is capable of making decisions on his own behalf. 6. Skin/Wound Care: Routine skin checks 7. Fluids/Electrolytes/Nutrition: Routine in and outs with follow-up chemistries 8.  Seizure prophylaxis.  Keppra 500 mg twice daily 9.  Tobacco abuse.  NicoDerm patch.  Provide counseling     Cathlyn Parsons, PA-C 01/02/2022  "I have personally performed a face to face diagnostic evaluation of this patient.  Additionally, I have reviewed and concur with the physician assistant's documentation above." [name] M.D. [facility] Fellow Am Acad of Phys Med and Rehab Diplomate Am Board of Electrodiagnostic Med Fellow Am Board of Interventional Pain

## 2022-01-02 NOTE — H&P (Incomplete)
Physical Medicine and Rehabilitation Admission H&P    Chief Complaint  Patient presents with   Emesis  : HPI: Randy Gallagher is a 51 year old right-handed male with history of tobacco use on no prescription medications.  Per chart review lives with his sister.  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  1 level home 3 steps to entry.  Presented 12/26/2021 with progressive headaches retro-orbital worse on the right.  Patient had noted over the last couple of weeks developed a subcutaneous collection in his forehead.  Denied any nausea vomiting or seizure.  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  T2 hyperintense swelling local mass effect and a 1 cm midline shift.  Patient underwent right frontal temporal parietal craniotomy resection of tumor utilizing intraoperative stereotaxis and microdissection 12/27/2021 per Dr. Annette Stable.  MRI follow-up showed no complicating feature after debulking of right superior temporal tumor.  Unchanged vasogenic edema and 9 mm of midline shift.  Maintained on Decadron with slow taper.  Keppra 500 mg twice daily for seizure prophylaxis.  Tolerating a mechanical soft diet.  Therapy evaluations completed due to patient decreased functional mobility was admitted for a comprehensive rehab program.  Review of Systems  Constitutional:  Negative for chills and fever.  HENT:  Negative for hearing loss.   Eyes:  Negative for blurred vision and double vision.  Respiratory:  Negative for cough and shortness of breath.   Cardiovascular:  Negative for chest pain and palpitations.  Gastrointestinal:  Positive for constipation. Negative for heartburn, nausea and vomiting.  Genitourinary:  Negative for dysuria, flank pain and hematuria.  Musculoskeletal:  Positive for myalgias.  Skin:  Negative for rash.  Neurological:  Positive for dizziness, weakness and headaches.  All other systems  reviewed and are negative. History reviewed. No pertinent past medical history. Past Surgical History:  Procedure Laterality Date   CRANIOTOMY Right 12/27/2021   Procedure: CRANIOTOMY TUMOR EXCISION;  Surgeon: Earnie Larsson, MD;  Location: [facility];  Service: Neurosurgery;  Laterality: Right;  brain lab   History reviewed. No pertinent family history. Social History:  reports that he has been smoking cigarettes. He has been smoking an average of 1 pack per day. He has never used smokeless tobacco. He reports that he does not currently use drugs. He reports that he does not drink alcohol. Allergies: No Known Allergies No medications prior to admission.      Home: Home Living Family/patient expects to be discharged to:: Private residence Living Arrangements: Other relatives Available Help at Discharge: Family, Available 24 hours/day Type of Home: Mobile home Home Access: Stairs to enter [employer] of Steps: 3 Entrance Stairs-Rails: Can reach both Home Layout: One level Bathroom Shower/Tub: Chiropodist: [facility] Home Equipment: Conservation officer, nature (2 wheels), [employer] - single point, Health visitor, Wheelchair - manual  Lives With: Family   Functional History: Prior Function Prior Level of Function : Independent/Modified Independent, Working/employed, Clinical cytogeneticist Status:  Mobility: Bed Mobility Overal bed mobility: Modified Independent Bed Mobility: Supine to Sit Supine to sit: Modified independent (Device/Increase time) Sit to supine: Min guard General bed mobility comments: Patient just returned to bed, deferred OOB again. Transfers Overall transfer level: Needs assistance Equipment used: None Transfers: Sit to/from Stand Sit to Stand: Min assist Bed to/from chair/wheelchair/BSC transfer type:: Step pivot Step pivot transfers: Min assist, Mod assist General transfer comment: lateral loss of balance with initial sit to  stand Ambulation/Gait Ambulation/Gait  assistance: Min assist, Mod assist Gait Distance (Feet): 250 Feet Assistive device:  (PRN support of wall or railing) Gait Pattern/deviations: Drifts right/left, Staggering right, Staggering left General Gait Details: pt with multiple lateral losses of balance, drifting toward wall or railing for support. Pt needs modA to correct 2-3 losses of balance during session Gait velocity: reduced Gait velocity interpretation: <1.8 ft/sec, indicate of risk for recurrent falls    ADL: ADL Overall ADL's : Needs assistance/impaired Eating/Feeding: Set up, Sitting Eating/Feeding Details (indicate cue type and reason): using his fingers at this point Grooming: Wash/dry hands, Min guard, Standing Upper Body Dressing : Minimal assistance, Sitting Lower Body Dressing: Moderate assistance, Sit to/from stand Toilet Transfer: Minimal assistance, Ambulation, Regular Toilet Toileting- Clothing Manipulation and Hygiene: Minimal assistance, Sit to/from stand Functional mobility during ADLs: Minimal assistance, Moderate assistance General ADL Comments: unsteady, impulsive and running into objects  Cognition: Cognition Overall Cognitive Status: Impaired/Different from baseline Arousal/Alertness:  (intermittently lethargic) Orientation Level: Oriented X4 Year: 2023 Month: February Day of Week: Incorrect Attention: Focused, Sustained Focused Attention: Impaired Focused Attention Impairment: Verbal complex Sustained Attention: Impaired Sustained Attention Impairment: Verbal basic Memory: Impaired Memory Impairment: Retrieval deficit, Decreased recall of new information (Immediate: 5/5 with repetition, delayed: 0/5; with cues: 0/5) Awareness: Impaired Awareness Impairment: Emergent impairment Problem Solving: Impaired Problem Solving Impairment: Verbal complex Executive Function: Sequencing, Technical brewer: Impaired Sequencing Impairment: Verbal  complex Organizing: Impaired Organizing Impairment: Verbal complex (backward digit span: 0/3) Cognition Arousal/Alertness: Awake/alert Behavior During Therapy: Impulsive Overall Cognitive Status: Impaired/Different from baseline Area of Impairment: Orientation, Attention, Memory, Following commands, Safety/judgement, Awareness, Problem solving Orientation Level:  (pt does not answer orientation questions for situation) Current Attention Level: Sustained Memory: Decreased recall of precautions, Decreased short-term memory Following Commands: Follows one step commands with increased time, Follows multi-step commands inconsistently Safety/Judgement: Decreased awareness of safety, Decreased awareness of deficits Awareness: Emergent Problem Solving: Slow processing, Difficulty sequencing, Requires verbal cues  Physical Exam: Blood pressure 116/71, pulse 84, temperature 98.2 F (36.8 C), temperature source Oral, resp. rate 18, height 5\' 10"  (1.778 m), weight 87 kg, SpO2 97 %. Physical Exam HENT:     Head:     Comments: Craniotomy site clean and dry.  Subcutaneous fluid collection consistent with subcutaneous cyst mid frontal region Neurological:     Comments: Patient is alert.  Makes eye contact with examiner.  Mood is a bit flat.  Provides name age and date of birth with some mild delay in processing.    No results found for this or any previous visit (from the past 48 hour(s)). No results found.    Blood pressure 116/71, pulse 84, temperature 98.2 F (36.8 C), temperature source Oral, resp. rate 18, height 5\' 10"  (1.778 m), weight 87 kg, SpO2 97 %.  Medical Problem List and Plan: 1. 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  Status post right frontal temporoparietal craniotomy resection of tumor 12/27/2021.  Follow-up per neurosurgery.  Slow taper of Decadron.  -patient may *** shower  -ELOS/Goals: *** 2.  Antithrombotics: -DVT/anticoagulation:  Mechanical:  Antiembolism stockings, thigh (TED hose) Bilateral lower extremities.    -antiplatelet therapy: N/A 3. Pain Management: Hydrocodone as needed. 4. Mood: Provide emotional support  -antipsychotic agents: N/A 5. Neuropsych: This patient is capable of making decisions on his own behalf. 6. Skin/Wound Care: Routine skin checks 7. Fluids/Electrolytes/Nutrition: Routine in and outs with follow-up chemistries 8.  Seizure prophylaxis.  Keppra 500 mg twice daily 9.  Tobacco abuse.  NicoDerm patch.  Provide  counseling     Cathlyn Parsons, PA-C 01/02/2022

## 2022-01-02 NOTE — Progress Notes (Signed)
Patient arrived from [facility]. Appears alert and makes no complaint of pain

## 2022-01-02 NOTE — Progress Notes (Signed)
Occupational Therapy Treatment Patient Details Name: Randy Gallagher MRN: [id number] DOB: Apr 19, 1971 Today's Date: 01/02/2022   History of present illness 51 y.o. male presents to [facility] hospital on 12/26/2021 with 2 weeks of progressive HA. 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 Pt underwent R frontotemporoparietal craniotomy and resection of tumor on 12/27/2021. No pertinent PMH on file.   OT comments  Pt progressing towards established OT goals. Pt continues to present with decreased cognition with poor attention, memory, problem solving, and executive functioning. Pt requiring cues during grooming tasks to recall tasks and maintain attention during transition between tasks. During trail making, pt requiring cues throughout for attention and return to task. Pt would benefit from further assessment of executive functioning. Continue to recommend dc to AIR and will continue to follow acutely as admitted.    Recommendations for follow up therapy are one component of a multi-disciplinary discharge planning process, led by the attending physician.  Recommendations may be updated based on patient status, additional functional criteria and insurance authorization.    Follow Up Recommendations  Acute inpatient rehab (3hours/day)    Assistance Recommended at Discharge Frequent or constant Supervision/Assistance  Patient can return home with the following  Help with stairs or ramp for entrance;Assist for transportation;Direct supervision/assist for financial management;Direct supervision/assist for medications management;A little help with walking and/or transfers;A little help with bathing/dressing/bathroom   Equipment Recommendations  Tub/shower seat    Recommendations for Other Services      Precautions / Restrictions Precautions Precautions: Fall Precaution Comments: swallowing Dys 3 diet Restrictions Weight Bearing Restrictions: No       Mobility Bed Mobility Overal bed mobility:  Modified Independent                  Transfers Overall transfer level: Needs assistance Equipment used: None Transfers: Sit to/from Stand Sit to Stand: Min guard                 Balance Overall balance assessment: Needs assistance Sitting-balance support: No upper extremity supported, Feet supported Sitting balance-Leahy Scale: Fair Sitting balance - Comments: decreased safety   Standing balance support: No upper extremity supported, During functional activity Standing balance-Leahy Scale: Fair Standing balance comment: min guard A                           ADL either performed or assessed with clinical judgement   ADL Overall ADL's : Needs assistance/impaired     Grooming: Wash/dry face;Oral care;Brushing hair;Min guard;Standing Grooming Details (indicate cue type and reason): Pt performing three grooming tasks at sink with Min Guard A. Requiring cues to recall 2/3 grooming tasks             Lower Body Dressing: Min guard;Sit to/from stand Lower Body Dressing Details (indicate cue type and reason): donning pants wiht min guard A for safety Toilet Transfer: Min guard;Ambulation (simulated to recliner)           Functional mobility during ADLs: Min guard General ADL Comments: Pt performing grooming tasks at sink and then functional mobility in hallway. Continue to present wiht decreased attention nad problem solving    Extremity/Trunk Assessment Upper Extremity Assessment Upper Extremity Assessment: Overall WFL for tasks assessed   Lower Extremity Assessment Lower Extremity Assessment: Defer to PT evaluation        Vision   Vision Assessment?: No apparent visual deficits   Perception Perception Perception: Within Functional Limits   Praxis Praxis Praxis: Intact  Cognition Arousal/Alertness: Awake/alert Behavior During Therapy: Impulsive (Slightky impulsive) Overall Cognitive Status: Impaired/Different from baseline Area of  Impairment: Attention, Memory, Following commands, Safety/judgement, Awareness, Problem solving                   Current Attention Level: Sustained (Easily distracted by environment and visual stimuli. Also distracted by internal dialogue) Memory: Decreased recall of precautions, Decreased short-term memory Following Commands: Follows one step commands with increased time, Follows multi-step commands inconsistently Safety/Judgement: Decreased awareness of safety, Decreased awareness of deficits Awareness: Emergent Problem Solving: Slow processing, Difficulty sequencing, Requires verbal cues General Comments: Pt demonstrating decresed ST memory during grooming; only recalling 1/3 grooming tasks instructed by therapist. Pt able to visually scan and locate items in vending machine and then verablize the appropiate code for each snack. Able to name 3/3 animals that start with "c" with significant time. Pt with decreased attention in distracting environments as well as increased distractions from internal thoughts. Requiring cues to recall and return to task        Exercises      Shoulder Instructions       General Comments vss    Pertinent Vitals/ Pain       Pain Assessment Pain Assessment: Faces Faces Pain Scale: No hurt Pain Intervention(s): Monitored during session  Home Living   Living Arrangements:  (sister and brother in law)                       Bathroom Accessibility: Yes How Accessible: Accessible via walker            Prior Functioning/Environment              Frequency  Min 2X/week        Progress Toward Goals  OT Goals(current goals can now be found in the care plan section)  Progress towards OT goals: Progressing toward goals  Acute Rehab OT Goals OT Goal Formulation: Patient unable to participate in goal setting Time For Goal Achievement: 01/12/22 Potential to Achieve Goals: [place] Discharge plan remains appropriate     Co-evaluation                 AM-PAC OT "6 Clicks" Daily Activity     Outcome Measure   Help from another person eating meals?: A Little Help from another person taking care of personal grooming?: A Little Help from another person toileting, which includes using toliet, bedpan, or urinal?: A Little Help from another person bathing (including washing, rinsing, drying)?: A Lot Help from another person to put on and taking off regular upper body clothing?: A Little Help from another person to put on and taking off regular lower body clothing?: A Little 6 Click Score: 17    End of Session    OT Visit Diagnosis: Unsteadiness on feet (R26.81);Other symptoms and signs involving cognitive function   Activity Tolerance Patient tolerated treatment well   Patient Left in chair;with call bell/phone within reach;with chair alarm set;with nursing/sitter in room   Nurse Communication Mobility status        Time: 2409-7353 OT Time Calculation (min): 23 min  Charges: OT General Charges $OT Visit: 1 Visit OT Treatments $Self Care/Home Management : 23-37 mins  [place], OTR/L Acute Rehab Pager: [phone] Office: [place] 01/02/2022, 10:18 AM

## 2022-01-02 NOTE — Discharge Summary (Signed)
Physician Discharge Summary     Providing Compassionate, Quality Care - Together   Patient ID: Randy Gallagher MRN: [id number] DOB/AGE: 1971-03-14 51 y.o.  Admit date: 12/26/2021 Discharge date: 01/02/2022  Admission Diagnoses: Brain lesion  Discharge Diagnoses:  Principal Problem:   Brain lesion   Discharged Condition: good  Hospital Course: Patient underwent a right craniotomy for tumor resection by Dr. Annette Gallagher on 12/27/2021. He was admitted to the ICU following recovery from anesthesia in the PACU. His postoperative course has been uncomplicated. He has worked with both physical and occupational therapies who feel the patient is a good candidate for discharge to [facility] acute rehab. He is ambulating with assistance. He is tolerating a normal diet. He is not having any bowel or bladder dysfunction. His pain is well-controlled with oral pain medication. He is ready for discharge to CIR.   Consults: rehabilitation medicine  Significant Diagnostic Studies: radiology: CT Head Wo Contrast  Result Date: 12/26/2021 CLINICAL DATA:  Headaches, nausea, vomiting EXAM: CT HEAD WITHOUT CONTRAST TECHNIQUE: Contiguous axial images were obtained from the base of the skull through the vertex without intravenous contrast. RADIATION DOSE REDUCTION: This exam was performed according to the departmental dose-optimization program which includes automated exposure control, adjustment of the mA and/or kV according to patient size and/or use of iterative reconstruction technique. COMPARISON:  None. FINDINGS: Brain: There is 4.4 x 3.4 cm area of focal inhomogeneous mass in the right frontotemporal region. There is marked surrounding vasogenic edema. There is extrinsic compression of right lateral ventricle. There is a proximally 10 mm shift of midline structures to the left. There is effacement of cortical sulci in the right cerebral hemisphere. There are no epidural or subdural fluid collections. No other discrete  focal space-occupying lesions are seen. Vascular: Unremarkable. Skull: No fracture is seen. There is 2.3 cm smooth marginated low-density lesion in the frontal scalp, possibly sebaceous cyst. Sinuses/Orbits: There is mucosal thickening in the ethmoid sinus. Other: None IMPRESSION: There is a large 4.4 cm inhomogeneous mass in the right temporal frontal cortex with surrounding vasogenic edema. There is mass effect with effacement of cortical sulci in the right cerebral hemisphere, extrinsic compression of right lateral ventricle and 10 mm shift of midline structures to the left. Follow-up contrast enhanced MRI and neurosurgical consultation should be considered. Imaging findings were relayed to Dr. Roderic Gallagher by telephone call. Electronically Signed   By: Randy Gallagher M.D.   On: 12/26/2021 19:58   MR BRAIN W WO CONTRAST  Result Date: 12/29/2021 CLINICAL DATA:  Brain/CNS neoplasm, assess treatment response EXAM: MRI HEAD WITHOUT AND WITH CONTRAST TECHNIQUE: Multiplanar, multiecho pulse sequences of the brain and surrounding structures were obtained without and with intravenous contrast. CONTRAST:  83mL GADAVIST GADOBUTROL 1 MMOL/ML IV SOLN COMPARISON:  Brain MRI from 2 days prior June in FINDINGS: Brain: Status post debulking of right superior temporal mass with expected blood products in place of the necrotic tumor. Peripheral enhancement around the resection cavity, most lobulated and thickest along the anterior superior margin. Similar degree of exuberant vasogenic edema with midline shift measuring 9 mm. No complicating infarct or hydrocephalus. Vascular: Major flow voids and vascular enhancements are preserved. Skull and upper cervical spine: Unremarkable craniotomy flap. Scalp fluid collection beneath the staples measuring up to 1 cm in thickness and reaching the zygoma. Presumed dermal inclusion cyst in the anterior scalp midline. Sinuses/Orbits: Unremarkable IMPRESSION: 1. No complicating feature after  debulking of right superior temporal tumor. New baseline study with greatest cavity wall enhancement  anteriorly and superiorly. 2. Unchanged vasogenic edema and 9 mm of midline shift. 3. Incisional scalp collection. 4. Motion degraded. Electronically Signed   By: Randy Gallagher M.D.   On: 12/29/2021 07:00   MR BRAIN W WO CONTRAST  Result Date: 12/27/2021 CLINICAL DATA:  CNS neoplasm EXAM: MRI HEAD WITHOUT AND WITH CONTRAST TECHNIQUE: Multiplanar, multiecho pulse sequences of the brain and surrounding structures were obtained without and with intravenous contrast. CONTRAST:  64mL GADAVIST GADOBUTROL 1 MMOL/ML IV SOLN COMPARISON:  Preceding head CT without contrast FINDINGS: Brain: Readily identified superior right temporal lobe mass measuring up to 4.2 cm. The mass has irregular peripheral enhancement suggesting necrosis . The periphery show signs of dense cellularity by T2 and diffusion imaging. No central purulent appearance of by diffusion imaging. Extensive adjacent T2 hyperintensity with swelling. Midline shift measures up to 1 cm at the level of the third ventricle. No second mass is seen. No acute hemorrhage by CT. No entrapment or infarct. Vascular: Major vessels are enhancing Skull and upper cervical spine: Normal marrow signal Sinuses/Orbits: Negative Other: Motion degraded. 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 T2 hyperintense swelling causes local mass effect and 1 cm of midline shift. Motion degraded. Electronically Signed   By: Randy Gallagher M.D.   On: 12/27/2021 05:58   DG Swallowing Func-Speech Pathology  Result Date: 12/30/2021 Table formatting from the original result was not included. Objective Swallowing Evaluation: Type of Study: MBS-Modified Barium Swallow Study  Patient Details Name: Randy Gallagher MRN: [id number] Date of Birth: Feb 19, 1971 Today's Date: 12/30/2021 Time: SLP Start Time (ACUTE ONLY): 1000 -SLP Stop  Time (ACUTE ONLY): 1019 SLP Time Calculation (min) (ACUTE ONLY): 19 min Past Medical History: No past medical history on file. Past Surgical History: Past Surgical History: Procedure Laterality Date  CRANIOTOMY Right 12/27/2021  Procedure: CRANIOTOMY TUMOR EXCISION;  Surgeon: Earnie Larsson, MD;  Location: [place];  Service: Neurosurgery;  Laterality: Right;  brain lab HPI: Pt is a 51 y.o. male presented to the ED with 2 weeks of progressive headaches. 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 Pt s/p R frontotemporoparietal craniotomy and resection of tumor on 2/21. No pertinent PMH on file.  No data recorded  Recommendations for follow up therapy are one component of a multi-disciplinary discharge planning process, led by the attending physician.  Recommendations may be updated based on patient status, additional functional criteria and insurance authorization. Assessment / Plan / Recommendation Clinical Impressions 12/30/2021 Clinical Impression Pt preesents with mild oropharyngeal dysphagia characterized by impaired mastication, reduced bolus cohesion, reduced lingual retraction, and an occasional pharyngeal delay. He exhibited intermittent oral residue, vallecular residue, and premature spillage. Penetration (PAS 2) was noted once with thin liquids, but no instances of aspiration were noted despite challenges of large/consecutive boluses. A dysphagia 3 diet with thin liquids is recommended at this time and SLP wil continue to follow pt. SLP Visit Diagnosis Dysphagia, oropharyngeal phase (R13.12) Attention and concentration deficit following -- Frontal lobe and executive function deficit following -- Impact on safety and function Mild aspiration risk   Treatment Recommendations 12/30/2021 Treatment Recommendations Therapy as outlined in treatment plan below   Prognosis 12/30/2021 Prognosis for Safe Diet Advancement Good Barriers to Reach Goals Cognitive  deficits Barriers/Prognosis Comment -- Diet Recommendations 12/30/2021 SLP Diet Recommendations Dysphagia 3 (Mech soft) solids;Thin liquid Liquid Administration via Cup;Straw Medication Administration Whole meds with liquid Compensations Small sips/bites;Slow  rate Postural Changes Seated upright at 90 degrees   Other Recommendations 12/30/2021 Recommended Consults -- Oral Care Recommendations -- Other Recommendations -- Follow Up Recommendations Acute inpatient rehab (3hours/day) Assistance recommended at discharge Frequent or constant Supervision/Assistance Functional Status Assessment Patient has had a recent decline in their functional status and demonstrates the ability to make significant improvements in function in a reasonable and predictable amount of time. Frequency and Duration  12/30/2021 Speech Therapy Frequency (ACUTE ONLY) min 2x/week Treatment Duration 1 week   Oral Phase 12/30/2021 Oral Phase Impaired Oral - Pudding Teaspoon -- Oral - Pudding Cup -- Oral - Honey Teaspoon -- Oral - Honey Cup -- Oral - Nectar Teaspoon -- Oral - Nectar Cup Decreased bolus cohesion Oral - Nectar Straw Decreased bolus cohesion Oral - Thin Teaspoon -- Oral - Thin Cup Decreased bolus cohesion;Premature spillage Oral - Thin Straw Decreased bolus cohesion;Premature spillage Oral - Puree WFL Oral - Mech Soft -- Oral - Regular Impaired mastication;Lingual/palatal residue Oral - Multi-Consistency -- Oral - Pill Reduced posterior propulsion Oral Phase - Comment --  Pharyngeal Phase 12/30/2021 Pharyngeal Phase Impaired Pharyngeal- Pudding Teaspoon -- Pharyngeal -- Pharyngeal- Pudding Cup -- Pharyngeal -- Pharyngeal- Honey Teaspoon -- Pharyngeal -- Pharyngeal- Honey Cup -- Pharyngeal -- Pharyngeal- Nectar Teaspoon -- Pharyngeal -- Pharyngeal- Nectar Cup -- Pharyngeal -- Pharyngeal- Nectar Straw Pharyngeal residue - valleculae;Reduced tongue base retraction Pharyngeal -- Pharyngeal- Thin Teaspoon -- Pharyngeal -- Pharyngeal- Thin Cup  Pharyngeal residue - valleculae;Reduced tongue base retraction;Delayed swallow initiation-vallecula;Delayed swallow initiation-pyriform sinuses Pharyngeal -- Pharyngeal- Thin Straw Pharyngeal residue - valleculae;Reduced tongue base retraction;Delayed swallow initiation-pyriform sinuses;Delayed swallow initiation-vallecula Pharyngeal -- Pharyngeal- Puree Pharyngeal residue - valleculae;Reduced tongue base retraction Pharyngeal -- Pharyngeal- Mechanical Soft -- Pharyngeal -- Pharyngeal- Regular Pharyngeal residue - valleculae;Reduced tongue base retraction Pharyngeal -- Pharyngeal- Multi-consistency -- Pharyngeal -- Pharyngeal- Pill Pharyngeal residue - valleculae;Reduced tongue base retraction Pharyngeal -- Pharyngeal Comment --  Cervical Esophageal Phase  12/30/2021 Cervical Esophageal Phase WFL Pudding Teaspoon -- Pudding Cup -- Honey Teaspoon -- Honey Cup -- Nectar Teaspoon -- Nectar Cup -- Nectar Straw -- Thin Teaspoon -- Thin Cup -- Thin Straw -- Puree -- Mechanical Soft -- Regular -- Multi-consistency -- Pill -- Cervical Esophageal Comment -- Shanika I. Hardin Negus, [place], [place] Office number [phone] Pager [place] 12/30/2021, 10:59 AM                       Treatments: surgery: Right frontotemporoparietal craniotomy and resection of tumor utilizing intraoperative stereotaxis and microdissection  Discharge Exam: Blood pressure (!) 128/98, pulse 79, temperature 98 F (36.7 C), resp. rate 20, height 5\' 10"  (1.778 m), weight 87 kg, SpO2 98 %.  Alert and oriented MAE, strength and sensation appear intact Incision OTA; wound is closed with staples; Surgical site is clean, dry, and intact  Disposition: Discharge disposition: [place] Not Defined        Allergies as of 01/02/2022   No Known Allergies      Medication List     TAKE these medications    dexamethasone 2 MG tablet Commonly known as: DECADRON Take 1  tablet (2 mg total) by mouth every 12 (twelve) hours.   docusate sodium 100 MG capsule Commonly known as: COLACE Take 1 capsule (100 mg total) by mouth 2 (two) times daily.   HYDROcodone-acetaminophen 5-325 MG tablet Commonly known as: NORCO/VICODIN Take 1-2 tablets by mouth every 4 (four) hours as needed for moderate pain.  levETIRAcetam 500 MG tablet Commonly known as: KEPPRA Take 1 tablet (500 mg total) by mouth 2 (two) times daily.   methocarbamol 500 mg in dextrose 5 % 50 mL Inject 500 mg into the vein every 6 (six) hours as needed.   nicotine 21 mg/24hr patch Commonly known as: NICODERM CQ - dosed in mg/24 hours Place 1 patch (21 mg total) onto the skin daily. Start taking on: January 03, 2022        Follow-up Information     Earnie Larsson, MD. Schedule an appointment as soon as possible for a visit in 2 week(s).   Specialty: Neurosurgery Contact information: [address] [place] [address] [phone]                 Signed: Viona Gilmore, DNP, AGNP-C Nurse Practitioner  [facility] [place] [address], [place], [place] [address] P: [phone]     F: [phone]  01/02/2022, 11:33 AM

## 2022-01-02 NOTE — PMR Pre-admission (Signed)
PMR Admission Coordinator Pre-Admission Assessment  Patient: Randy Gallagher is an 51 y.o., male MRN: [id number] DOB: [birth year] Height: 5' 10"  (177.8 cm) Weight: 87 kg  Insurance Information HMO:     PPO: yes     PCP:      IPA:      80/20:      OTHER:  PRIMARY: [employer] [place]      Policy#: [id number]      Subscriber: pt CM Name: faxed approval      Phone#: [phone]     Fax#: [phone] Pre-Cert#: [id number]   approved until 3/13   Employer: [employer] Benefits:  Phone #: [phone]     Name: 2/24 Eff. Date: 11/06/21     Deduct: $1000      Out of Pocket Max: $4000      Life Max: none CIR: 70%      SNF: 70% 60 days Outpatient: $70 per visit     Co-Pay: 20 visits combined Home Health: 70%      Co-Pay: visits per medical neccesity DME: 70%     Co-Pay: 30% Providers: in network  SECONDARY: none  Financial Counselor:       Phone#:   [facility] for patients in Inpatient Rehabilitation Facilities with attached Privacy Act [place] Records was provided and verbally reviewed with: N/A  Emergency Contact Information Contact Information     Name Relation Home Work [place] Sister   [phone]      Current Medical History  Patient Admitting Diagnosis: tumor resection  History of Present Illness: Randy Gallagher is a 51 year old right-handed male with history of tobacco use on no prescription medications.  Per chart review lives with his sister.  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  1 level home 3 steps to entry.  Presented 12/26/2021 with progressive headaches retro-orbital worse on the right.  Patient had noted over the last couple of weeks developed a subcutaneous collection in his forehead.  Denied any nausea vomiting or seizure.  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  T2 hyperintense swelling local mass  effect and a 1 cm midline shift.  Patient underwent right frontal temporal parietal craniotomy resection of tumor utilizing intraoperative stereotaxis and microdissection 12/27/2021 per Dr. Annette Stable.  MRI follow-up showed no complicating feature after debulking of right superior temporal tumor.  Unchanged vasogenic edema and 9 mm of midline shift.  Maintained on Decadron with slow taper.  Keppra 500 mg twice daily for seizure prophylaxis.  Tolerating a mechanical soft diet.    Patient's medical record [facility] has been reviewed by the rehabilitation admission coordinator and physician.  Past Medical History  History reviewed. No pertinent past medical history.  Has the patient had major surgery during 100 days prior to admission? Yes  Family History   family history is not on file.  Current Medications  Current Facility-Administered Medications:    acetaminophen (TYLENOL) tablet 650 mg, 650 mg, Oral, Q6H PRN **OR** acetaminophen (TYLENOL) suppository 650 mg, 650 mg, Rectal, Q6H PRN, Pool, Mallie Mussel, MD   bisacodyl (DULCOLAX) suppository 10 mg, 10 mg, Rectal, Daily PRN, Earnie Larsson, MD   Chlorhexidine Gluconate Cloth 2 % PADS 6 each, 6 each, Topical, Daily, Earnie Larsson, MD, 6 each at 01/01/22 1005   dexamethasone (DECADRON) tablet 2 mg, 2 mg, Oral, Q12H, Bergman, Meghan D, NP, 2 mg at 01/02/22 [phone]  docusate sodium (COLACE) capsule 100 mg, 100 mg, Oral, BID, Earnie Larsson, MD, 100 mg at 01/01/22 2131   HYDROcodone-acetaminophen (NORCO/VICODIN) 5-325 MG per tablet 1-2 tablet, 1-2 tablet, Oral, Q4H PRN, Earnie Larsson, MD, 2 tablet at 12/31/21 2312   HYDROmorphone (DILAUDID) injection 0.5-1 mg, 0.5-1 mg, Intravenous, Q2H PRN, Earnie Larsson, MD, 1 mg at 12/28/21 3154   labetalol (NORMODYNE) injection 10-40 mg, 10-40 mg, Intravenous, Q10 min PRN, Earnie Larsson, MD   levETIRAcetam (KEPPRA) tablet 500 mg, 500 mg, Oral, BID, Bergman, Meghan D, NP, 500 mg at 01/02/22 0856   methocarbamol (ROBAXIN) 500  mg in dextrose 5 % 50 mL IVPB, 500 mg, Intravenous, Q6H PRN, Earnie Larsson, MD, Stopped at 12/28/21 0119   nicotine (NICODERM CQ - dosed in mg/24 hours) patch 21 mg, 21 mg, Transdermal, Daily, Consuella Lose, MD, 21 mg at 01/02/22 0859   ondansetron (ZOFRAN) tablet 4 mg, 4 mg, Oral, Q4H PRN **OR** ondansetron (ZOFRAN) injection 4 mg, 4 mg, Intravenous, Q4H PRN, Pool, Mallie Mussel, MD, 4 mg at 12/28/21 2013   pantoprazole (PROTONIX) EC tablet 40 mg, 40 mg, Oral, QHS, Donnamae Jude, RPH, 40 mg at 01/01/22 2131   polyethylene glycol (MIRALAX / GLYCOLAX) packet 17 g, 17 g, Oral, Daily PRN, Earnie Larsson, MD   promethazine (PHENERGAN) tablet 12.5-25 mg, 12.5-25 mg, Oral, Q4H PRN, Earnie Larsson, MD   sodium phosphate (FLEET) 7-19 GM/118ML enema 1 enema, 1 enema, Rectal, Once PRN, Earnie Larsson, MD   zolpidem (AMBIEN) tablet 5 mg, 5 mg, Oral, QHS PRN,MR X 1, Pool, Mallie Mussel, MD  Patients Current Diet:  Diet Order             DIET DYS 3 Room service appropriate? Yes with Assist; Fluid consistency: Thin  Diet effective now                  Precautions / Restrictions Precautions Precautions: Fall Precaution Comments: swallowing Dys 3 diet Restrictions Weight Bearing Restrictions: No   Has the patient had 2 or more falls or a fall with injury in the past year? No  Prior Activity Level Community (5-7x/wk): independent, working, employed, driving  Prior Functional Level Self Care: Did the patient need help bathing, dressing, using the toilet or eating? Independent  Indoor Mobility: Did the patient need assistance with walking from room to room (with or without device)? Independent  Stairs: Did the patient need assistance with internal or external stairs (with or without device)? Independent  Functional Cognition: Did the patient need help planning regular tasks such as shopping or remembering to take medications? Independent  Patient Information Are you of Hispanic, Latino/a,or Spanish origin?: A. No,  not of Hispanic, Latino/a, or Spanish origin What is your race?: A. White Do you need or want an interpreter to communicate with a doctor or health care staff?: 0. No  Patient's Response To:  Health Literacy and Transportation Is the patient able to respond to health literacy and transportation needs?: Yes Health Literacy - How often do you need to have someone help you when you read instructions, pamphlets, or other written material from your doctor or pharmacy?: Never In the past 12 months, has lack of transportation kept you from medical appointments or from getting medications?: No In the past 12 months, has lack of transportation kept you from meetings, work, or from getting things needed for daily living?: No  Development worker, international aid / [place] Devices/Equipment: None Home Equipment: Conservation officer, nature (2 wheels), [employer] - single point, Health visitor, Wheelchair -  manual  Prior Device Use: Indicate devices/aids used by the patient prior to current illness, exacerbation or injury? None of the above  Current Functional Level Cognition  Arousal/Alertness:  (intermittently lethargic) Overall Cognitive Status: Impaired/Different from baseline Current Attention Level: Sustained (Easily distracted by environment and visual stimuli. Also distracted by internal dialogue) Orientation Level: Oriented X4 Following Commands: Follows one step commands with increased time, Follows multi-step commands inconsistently Safety/Judgement: Decreased awareness of safety, Decreased awareness of deficits General Comments: Pt demonstrating decresed ST memory during grooming; only recalling 1/3 grooming tasks instructed by therapist. Pt able to visually scan and locate items in vending machine and then verablize the appropiate code for each snack. Able to name 3/3 animals that start with "c" with significant time. Pt with decreased attention in distracting environments as well as increased distractions from  internal thoughts. Requiring cues to recall and return to task Attention: Focused, Sustained Focused Attention: Impaired Focused Attention Impairment: Verbal complex Sustained Attention: Impaired Sustained Attention Impairment: Verbal basic Memory: Impaired Memory Impairment: Retrieval deficit, Decreased recall of new information (Immediate: 5/5 with repetition, delayed: 0/5; with cues: 0/5) Awareness: Impaired Awareness Impairment: Emergent impairment Problem Solving: Impaired Problem Solving Impairment: Verbal complex Executive Function: Sequencing, Technical brewer: Impaired Sequencing Impairment: Verbal complex Organizing: Impaired Organizing Impairment: Verbal complex (backward digit span: 0/3)    Extremity Assessment (includes Sensation/Coordination)  Upper Extremity Assessment: Overall WFL for tasks assessed  Lower Extremity Assessment: Defer to PT evaluation    ADLs  Overall ADL's : Needs assistance/impaired Eating/Feeding: Set up, Sitting Eating/Feeding Details (indicate cue type and reason): using his fingers at this point Grooming: Wash/dry face, Oral care, Brushing hair, Min guard, Standing Grooming Details (indicate cue type and reason): Pt performing three grooming tasks at sink with Min Guard A. Requiring cues to recall 2/3 grooming tasks Upper Body Dressing : Minimal assistance, Sitting Lower Body Dressing: Min guard, Sit to/from stand Lower Body Dressing Details (indicate cue type and reason): donning pants wiht min guard A for safety Toilet Transfer: Min guard, Ambulation (simulated to recliner) Toileting- Clothing Manipulation and Hygiene: Minimal assistance, Sit to/from stand Functional mobility during ADLs: Min guard General ADL Comments: Pt performing grooming tasks at sink and then functional mobility in hallway. Continue to present wiht decreased attention nad problem solving    Mobility  Overal bed mobility: Modified Independent Bed Mobility:  Supine to Sit Supine to sit: Modified independent (Device/Increase time) Sit to supine: Min guard General bed mobility comments: Patient just returned to bed, deferred OOB again.    Transfers  Overall transfer level: Needs assistance Equipment used: None Transfers: Sit to/from Stand Sit to Stand: Min guard Bed to/from chair/wheelchair/BSC transfer type:: Step pivot Step pivot transfers: Min assist, Mod assist General transfer comment: lateral loss of balance with initial sit to stand    Ambulation / Gait / Stairs / Wheelchair Mobility  Ambulation/Gait Ambulation/Gait assistance: Min assist, Mod assist Gait Distance (Feet): 250 Feet Assistive device:  (PRN support of wall or railing) Gait Pattern/deviations: Drifts right/left, Staggering right, Staggering left General Gait Details: pt with multiple lateral losses of balance, drifting toward wall or railing for support. Pt needs modA to correct 2-3 losses of balance during session Gait velocity: reduced Gait velocity interpretation: <1.8 ft/sec, indicate of risk for recurrent falls    Posture / Balance Dynamic Sitting Balance Sitting balance - Comments: decreased safety Balance Overall balance assessment: Needs assistance Sitting-balance support: No upper extremity supported, Feet supported Sitting balance-Leahy Scale: Fair Sitting balance - Comments:  decreased safety Standing balance support: No upper extremity supported, During functional activity Standing balance-Leahy Scale: Fair Standing balance comment: min guard A    Special needs/care consideration Decreased safety awareness Fall risk   Previous Home Environment  Living Arrangements:  (sister and brother in Sports coach)  Lives With: Family Available Help at Discharge: Family, Available 24 hours/day Type of Home: Mobile home Home Layout: One level Home Access: Stairs to enter Entrance Stairs-Rails: Can reach both Entrance Stairs-Number of Steps: 3 Bathroom Shower/Tub:  Optometrist: Yes How Accessible: Accessible via walker [place]: No  Discharge Living Setting Plans for Discharge Living Setting: Lives with (comment) (sister and brother in law) Type of Home at Discharge: Mobile home Discharge Home Layout: One level Discharge Home Access: Stairs to enter Entrance Stairs-Rails: Right, Left, Can reach both Entrance Stairs-Number of Steps: 3 Discharge Bathroom Shower/Tub: Tub/shower unit Discharge Bathroom Toilet: Standard Discharge Bathroom Accessibility: Yes How Accessible: Accessible via walker Does the patient have any problems obtaining your medications?: No  Social/Family/Support Systems Contact Information: sister, Judeen Hammans Anticipated Caregiver: sister and brother in law Anticipated Caregiver's Contact Information: see contacts Ability/Limitations of Caregiver: sistr works but will arrange 24/7 Caregiver Availability: 24/7 Discharge Plan Discussed with Primary Caregiver: Yes Is Caregiver In Agreement with Plan?: Yes Does Caregiver/Family have Issues with Lodging/Transportation while Pt is in Rehab?: No  Goals Patient/Family Goal for Rehab: supervision to intermittent min assist with PT, OT and SLP Expected length of stay: ELOS 10 to 12 days Pt/Family Agrees to Admission and willing to participate: Yes Program Orientation Provided & Reviewed with Pt/Caregiver Including Roles  & Responsibilities: Yes  Progressing rapidly so likely shorter LOS than expected  Decrease burden of Care through IP rehab admission: n/a  Possible need for SNF placement upon discharge: not anticipated  Patient Condition: I have reviewed medical records from [facility], spoken with CM, and patient and family member. I met with patient at the bedside for inpatient rehabilitation assessment.  Patient will benefit from ongoing PT, OT, and SLP, can actively participate in 3 hours of therapy a day 5  days of the week, and can make measurable gains during the admission.  Patient will also benefit from the coordinated team approach during an Inpatient Acute Rehabilitation admission.  The patient will receive intensive therapy as well as Rehabilitation physician, nursing, social worker, and care management interventions.  Due to bladder management, bowel management, safety, skin/wound care, disease management, medication administration, pain management, and patient education the patient requires 24 hour a day rehabilitation nursing.  The patient is currently min  assist overall with mobility and basic ADLs.  Discharge setting and therapy post discharge at home with home health is anticipated.  Patient has agreed to participate in the Acute Inpatient Rehabilitation Program and will admit today.  Preadmission Screen Completed By:  Cleatrice Burke, 01/02/2022 11:14 AM ______________________________________________________________________   Discussed status with Dr. Letta Pate on 01/02/22 at 1114 and received approval for admission today.  Admission Coordinator:  Cleatrice Burke, RN, time  4010 Date 01/02/22   Assessment/Plan: Diagnosis:  Right temporo frontal brain tumor Does the need for close, 24 hr/day Medical supervision in concert with the patient's rehab needs make it unreasonable for this patient to be served in a less intensive setting? Yes Co-Morbidities requiring supervision/potential complications: on seizure prophyllaxis, on Decadron for cerebral edema, monitor CBG Due to bladder management, bowel management, safety, skin/wound care, disease management, medication administration, pain management, and patient education,  does the patient require 24 hr/day rehab nursing? Yes Does the patient require coordinated care of a physician, rehab nurse, PT, OT, and SLP to address physical and functional deficits in the context of the above medical diagnosis(es)? Yes Addressing deficits in the  following areas: balance, endurance, locomotion, strength, transferring, bowel/bladder control, bathing, dressing, feeding, grooming, toileting, cognition, speech, language, swallowing, and psychosocial support Can the patient actively participate in an intensive therapy program of at least 3 hrs of therapy 5 days a week? Yes The potential for patient to make measurable gains while on inpatient rehab is good Anticipated functional outcomes upon discharge from inpatient rehab: modified independent and supervision PT, modified independent and supervision OT, modified independent and supervision SLP Estimated rehab length of stay to reach the above functional goals is: 10-12d Anticipated discharge destination: Home 10. Overall Rehab/Functional Prognosis: good   MD Signature: [name] M.D. [place] [facility] Fellow Am Acad of Phys Med and Rehab Diplomate Am Board of Electrodiagnostic Med Fellow Am Board of Interventional Pain

## 2022-01-02 NOTE — Progress Notes (Signed)
Inpatient Rehabilitation Admission Medication Review by a Pharmacist  A complete drug regimen review was completed for this patient to identify any potential clinically significant medication issues.  High Risk Drug Classes Is patient taking? Indication by Medication  Antipsychotic No   Anticoagulant No   Antibiotic No   Opioid Yes Norco- acute pain  Antiplatelet No   Hypoglycemics/insulin No   Vasoactive Medication No   Chemotherapy No   Other Yes Protonix- GERD Keppra- seizure prophylaxis     Type of Medication Issue Identified Description of Issue Recommendation(s)  Drug Interaction(s) (clinically significant)     Duplicate Therapy     Allergy     No Medication Administration End Date     Incorrect Dose     Additional Drug Therapy Needed     Significant med changes from prior encounter (inform family/care partners about these prior to discharge).    Other       Clinically significant medication issues were identified that warrant physician communication and completion of prescribed/recommended actions by midnight of the next day:  No  Time spent performing this drug regimen review (minutes):  30   Angeligue Bowne BS, PharmD, BCPS Clinical Pharmacist 01/03/2022 7:51 AM

## 2022-01-02 NOTE — Progress Notes (Signed)
Physical Therapy Treatment Patient Details Name: Randy Gallagher MRN: [id number] DOB: 11-21-1970 Today's Date: 01/02/2022   History of Present Illness 51 y.o. male presents to [facility] hospital on 12/26/2021 with 2 weeks of progressive HA. 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 Pt underwent R frontotemporoparietal craniotomy and resection of tumor on 12/27/2021. No pertinent PMH on file.    PT Comments    Pt making progress with balance, able to tolerate dynamic sitting and standing activities and beginning to be able to reach out of BOS. However, insight remains significantly limited and pt becomes easily distracted when out in hallway. Distracted by environmental objects as well as internally distracted. He needs frequent redirection and balance is more impaired when he is not attending to his mobility.   Continue to recommend AIR. PT will continue to follow.   Recommendations for follow up therapy are one component of a multi-disciplinary discharge planning process, led by the attending physician.  Recommendations may be updated based on patient status, additional functional criteria and insurance authorization.  Follow Up Recommendations  Acute inpatient rehab (3hours/day)     Assistance Recommended at Discharge Frequent or constant Supervision/Assistance  Patient can return home with the following A lot of help with walking and/or transfers;A lot of help with bathing/dressing/bathroom;Assistance with cooking/housework;Assistance with feeding;Direct supervision/assist for medications management;Direct supervision/assist for financial management;Assist for transportation;Help with stairs or ramp for entrance   Equipment Recommendations  BSC/3in1    Recommendations for Other Services Rehab consult     Precautions / Restrictions Precautions Precautions: Fall Precaution Comments: swallowing Dys 3 diet Restrictions Weight Bearing Restrictions: No     Mobility  Bed  Mobility Overal bed mobility: Modified Independent Bed Mobility: Supine to Sit     Supine to sit: Modified independent (Device/Increase time)     General bed mobility comments: increased time needed    Transfers Overall transfer level: Needs assistance Equipment used: None Transfers: Sit to/from Stand Sit to Stand: Min guard   Step pivot transfers: Min guard       General transfer comment: increased sway with pivot bed to chair but did not fully lose balance, able to self correct    Ambulation/Gait Ambulation/Gait assistance: Min assist, Mod assist Gait Distance (Feet): 200 Feet Assistive device: None Gait Pattern/deviations: Drifts right/left, Staggering right Gait velocity: reduced Gait velocity interpretation: <1.8 ft/sec, indicate of risk for recurrent falls   General Gait Details: pt has difficulty changing gait speeds, needed mod A to ambulate bkwds and staggered R with this, has difficulty with quick starts and stops   Chief Strategy Officer    Modified Rankin (Stroke Patients Only)       Balance Overall balance assessment: Needs assistance Sitting-balance support: No upper extremity supported, Feet supported Sitting balance-Leahy Scale: Fair Sitting balance - Comments: decreased safety   Standing balance support: No upper extremity supported, During functional activity Standing balance-Leahy Scale: Fair Standing balance comment: min guard A               High Level Balance Comments: worked on dyamic balance in sitting edge of chair, then standing. Pt beginning to be able to reach out of BOS without LOB            Cognition Arousal/Alertness: Awake/alert Behavior During Therapy: Impulsive (Slightly impulsive) Overall Cognitive Status: Impaired/Different from baseline Area of Impairment: Attention, Memory, Following commands, Safety/judgement, Awareness, Problem solving  Current Attention  Level: Sustained (Easily distracted by environment and visual stimuli. Also distracted by internal dialogue) Memory: Decreased recall of precautions, Decreased short-term memory Following Commands: Follows one step commands with increased time, Follows multi-step commands inconsistently Safety/Judgement: Decreased awareness of safety, Decreased awareness of deficits Awareness: Emergent Problem Solving: Slow processing, Difficulty sequencing, Requires verbal cues General Comments: pt with limited insight into deficits and limitations. Easily distracted by environment and internal thoughts and needs redirection. Begins a task, forgets what he is doing partway through and needs redirecting        Exercises      General Comments General comments (skin integrity, edema, etc.): VSS. Pt verbalizes concerns about his dog because he is depressed and not eating pt says      Pertinent Vitals/Pain Pain Assessment Pain Assessment: Faces Faces Pain Scale: Hurts a little bit Pain Location: head Pain Descriptors / Indicators: Aching Pain Intervention(s): Limited activity within patient's tolerance, Monitored during session    Home Living                          Prior Function            PT Goals (current goals can now be found in the care plan section) Acute Rehab PT Goals Patient Stated Goal: get home to his dog PT Goal Formulation: With family Time For Goal Achievement: 01/12/22 Potential to Achieve Goals: Fair Progress towards PT goals: Progressing toward goals    Frequency    Min 3X/week      PT Plan Current plan remains appropriate    Co-evaluation              AM-PAC PT "6 Clicks" Mobility   Outcome Measure  Help needed turning from your back to your side while in a flat bed without using bedrails?: None Help needed moving from lying on your back to sitting on the side of a flat bed without using bedrails?: None Help needed moving to and from a bed to a  chair (including a wheelchair)?: A Little Help needed standing up from a chair using your arms (e.g., wheelchair or bedside chair)?: A Little Help needed to walk in hospital room?: A Lot Help needed climbing 3-5 steps with a railing? : Total 6 Click Score: 17    End of Session Equipment Utilized During Treatment: Gait belt Activity Tolerance: Patient tolerated treatment well Patient left: in chair;with call bell/phone within reach;with chair alarm set Nurse Communication: Mobility status PT Visit Diagnosis: Other abnormalities of gait and mobility (R26.89);Muscle weakness (generalized) (M62.81);Other symptoms and signs involving the nervous system (R29.898)     Time: 3709-6438 PT Time Calculation (min) (ACUTE ONLY): 25 min  Charges:  $Gait Training: 23-37 mins                     Leighton Roach, [place]  Pager [phone] Office [place] 01/02/2022, 12:02 PM

## 2022-01-02 NOTE — Discharge Instructions (Signed)
Remove staples on 01/06/2022.

## 2022-01-02 NOTE — Progress Notes (Signed)
Looks much better.  Overall more appropriate.  Speech is fluent.  Patient states a desire for discharge home.  Awake and aware.  Oriented and reasonably appropriate.  Motor and sensory function intact.  Wound clean and dry.  Neck supple.  Chest and abdomen benign.  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  Patient overall appears to be doing reasonably well.  Will need to make decision with regard to discharge plan rehab versus home with home therapies.  Hopefully will get some guidance from therapy and inpatient rehab today.

## 2022-01-02 NOTE — Progress Notes (Signed)
Randy Blake, MD  Physician Physical Medicine and Rehabilitation PMR Pre-admission     Signed Date of Service:  01/02/2022  8:49 AM  Related encounter: ED to Hosp-Admission (Current) from 12/26/2021 in [facility]      Show:Clear all [x] Written[x] Templated[x] Copied  Added by: [x] Cristina Gong, RN[x] Kirsteins, Luanna Salk, MD  [] Hover for details                                                                                                                                                                                                                                                                                                                                                                                                                                                 PMR Admission Coordinator Pre-Admission Assessment   Patient: Randy Gallagher is an 51 y.o., male MRN: [id number] DOB: August 23, 1971 Height: 5' 10"  (177.8 cm) Weight: 87 kg   Insurance Information HMO:     PPO: yes     PCP:      IPA:      80/20:      OTHER:  PRIMARY: [employer] [place]      Policy#: [id number]      Subscriber: pt CM Name: faxed approval      Phone#: [phone]     Fax#: [phone] Pre-Cert#: [id number]   approved  until 3/13   Employer: [employer] Benefits:  Phone #: [phone]     Name: 2/24 Eff. Date: 11/06/21     Deduct: $1000      Out of Pocket Max: $4000      Life Max: none CIR: 70%      SNF: 70% 60 days Outpatient: $70 per visit     Co-Pay: 20 visits combined Home Health: 70%      Co-Pay: visits per medical neccesity DME: 70%     Co-Pay: 30% Providers: in network  SECONDARY: none   Financial Counselor:       Phone#:    [facility] for patients in Inpatient Rehabilitation  Facilities with attached Privacy Act [place] Records was provided and verbally reviewed with: N/A   Emergency Contact Information Contact Information       Name Relation Home Work Golf Sister     [phone]         Current Medical History  Patient Admitting Diagnosis: tumor resection   History of Present Illness: Randy Gallagher is a 51 year old right-handed male with history of tobacco use on no prescription medications.  Per chart review lives with his sister.  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  1 level home 3 steps to entry.  Presented 12/26/2021 with progressive headaches retro-orbital worse on the right.  Patient had noted over the last couple of weeks developed a subcutaneous collection in his forehead.  Denied any nausea vomiting or seizure.  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  T2 hyperintense swelling local mass effect and a 1 cm midline shift.  Patient underwent right frontal temporal parietal craniotomy resection of tumor utilizing intraoperative stereotaxis and microdissection 12/27/2021 per Dr. Annette Stable.  MRI follow-up showed no complicating feature after debulking of right superior temporal tumor.  Unchanged vasogenic edema and 9 mm of midline shift.  Maintained on Decadron with slow taper.  Keppra 500 mg twice daily for seizure prophylaxis.  Tolerating a mechanical soft diet.     Patient's medical record from [facility] has been reviewed by the rehabilitation admission coordinator and physician.   Past Medical History  History reviewed. No pertinent past medical history.   Has the patient had major surgery during 100 days prior to admission? Yes   Family History   family history is not on file.   Current Medications   Current Facility-Administered Medications:    acetaminophen (TYLENOL) tablet 650 mg, 650 mg, Oral, Q6H PRN  **OR** acetaminophen (TYLENOL) suppository 650 mg, 650 mg, Rectal, Q6H PRN, Pool, Mallie Mussel, MD   bisacodyl (DULCOLAX) suppository 10 mg, 10 mg, Rectal, Daily PRN, Earnie Larsson, MD   Chlorhexidine Gluconate Cloth 2 % PADS 6 each, 6 each, Topical, Daily, Pool, Mallie Mussel, MD, 6 each at 01/01/22 1005   dexamethasone (DECADRON) tablet 2 mg, 2 mg, Oral, Q12H, Bergman, Meghan D, NP, 2 mg at 01/02/22 0856   docusate sodium (COLACE) capsule 100 mg, 100 mg, Oral, BID, Pool, Mallie Mussel, MD, 100 mg at 01/01/22 2131   HYDROcodone-acetaminophen (NORCO/VICODIN) 5-325 MG per tablet 1-2 tablet, 1-2 tablet, Oral, Q4H PRN, Earnie Larsson, MD, 2 tablet at 12/31/21 2312   HYDROmorphone (DILAUDID) injection 0.5-1 mg, 0.5-1 mg, Intravenous, Q2H PRN, Earnie Larsson, MD, 1 mg at 12/28/21 0027   labetalol (NORMODYNE) injection 10-40 mg, 10-40 mg, Intravenous, Q10 min PRN, Earnie Larsson, MD   levETIRAcetam (KEPPRA)  tablet 500 mg, 500 mg, Oral, BID, Bergman, Meghan D, NP, 500 mg at 01/02/22 0856   methocarbamol (ROBAXIN) 500 mg in dextrose 5 % 50 mL IVPB, 500 mg, Intravenous, Q6H PRN, Earnie Larsson, MD, Stopped at 12/28/21 0119   nicotine (NICODERM CQ - dosed in mg/24 hours) patch 21 mg, 21 mg, Transdermal, Daily, Consuella Lose, MD, 21 mg at 01/02/22 0859   ondansetron (ZOFRAN) tablet 4 mg, 4 mg, Oral, Q4H PRN **OR** ondansetron (ZOFRAN) injection 4 mg, 4 mg, Intravenous, Q4H PRN, Pool, Mallie Mussel, MD, 4 mg at 12/28/21 2013   pantoprazole (PROTONIX) EC tablet 40 mg, 40 mg, Oral, QHS, Donnamae Jude, RPH, 40 mg at 01/01/22 2131   polyethylene glycol (MIRALAX / GLYCOLAX) packet 17 g, 17 g, Oral, Daily PRN, Pool, Mallie Mussel, MD   promethazine (PHENERGAN) tablet 12.5-25 mg, 12.5-25 mg, Oral, Q4H PRN, Pool, Mallie Mussel, MD   sodium phosphate (FLEET) 7-19 GM/118ML enema 1 enema, 1 enema, Rectal, Once PRN, Earnie Larsson, MD   zolpidem (AMBIEN) tablet 5 mg, 5 mg, Oral, QHS PRN,MR X 1, Pool, Mallie Mussel, MD   Patients Current Diet:  Diet Order                  DIET DYS 3  Room service appropriate? Yes with Assist; Fluid consistency: Thin  Diet effective now                       Precautions / Restrictions Precautions Precautions: Fall Precaution Comments: swallowing Dys 3 diet Restrictions Weight Bearing Restrictions: No    Has the patient had 2 or more falls or a fall with injury in the past year? No   Prior Activity Level Community (5-7x/wk): independent, working, employed, driving   Prior Functional Level Self Care: Did the patient need help bathing, dressing, using the toilet or eating? Independent   Indoor Mobility: Did the patient need assistance with walking from room to room (with or without device)? Independent   Stairs: Did the patient need assistance with internal or external stairs (with or without device)? Independent   Functional Cognition: Did the patient need help planning regular tasks such as shopping or remembering to take medications? Independent   Patient Information Are you of Hispanic, Latino/a,or Spanish origin?: A. No, not of Hispanic, Latino/a, or Spanish origin What is your race?: A. White Do you need or want an interpreter to communicate with a doctor or health care staff?: 0. No   Patient's Response To:  Health Literacy and Transportation Is the patient able to respond to health literacy and transportation needs?: Yes Health Literacy - How often do you need to have someone help you when you read instructions, pamphlets, or other written material from your doctor or pharmacy?: Never In the past 12 months, has lack of transportation kept you from medical appointments or from getting medications?: No In the past 12 months, has lack of transportation kept you from meetings, work, or from getting things needed for daily living?: No   Development worker, international aid / [place] Devices/Equipment: None Home Equipment: Conservation officer, nature (2 wheels), [place] - single point, Crutches, Wheelchair - manual   Prior Device  Use: Indicate devices/aids used by the patient prior to current illness, exacerbation or injury? None of the above   Current Functional Level Cognition   Arousal/Alertness:  (intermittently lethargic) Overall Cognitive Status: Impaired/Different from baseline Current Attention Level: Sustained (Easily distracted by environment and visual stimuli. Also distracted by internal dialogue) Orientation Level: Oriented  X4 Following Commands: Follows one step commands with increased time, Follows multi-step commands inconsistently Safety/Judgement: Decreased awareness of safety, Decreased awareness of deficits General Comments: Pt demonstrating decresed ST memory during grooming; only recalling 1/3 grooming tasks instructed by therapist. Pt able to visually scan and locate items in vending machine and then verablize the appropiate code for each snack. Able to name 3/3 animals that start with "c" with significant time. Pt with decreased attention in distracting environments as well as increased distractions from internal thoughts. Requiring cues to recall and return to task Attention: Focused, Sustained Focused Attention: Impaired Focused Attention Impairment: Verbal complex Sustained Attention: Impaired Sustained Attention Impairment: Verbal basic Memory: Impaired Memory Impairment: Retrieval deficit, Decreased recall of new information (Immediate: 5/5 with repetition, delayed: 0/5; with cues: 0/5) Awareness: Impaired Awareness Impairment: Emergent impairment Problem Solving: Impaired Problem Solving Impairment: Verbal complex Executive Function: Sequencing, Technical brewer: Impaired Sequencing Impairment: Verbal complex Organizing: Impaired Organizing Impairment: Verbal complex (backward digit span: 0/3)    Extremity Assessment (includes Sensation/Coordination)   Upper Extremity Assessment: Overall WFL for tasks assessed  Lower Extremity Assessment: Defer to PT evaluation     ADLs    Overall ADL's : Needs assistance/impaired Eating/Feeding: Set up, Sitting Eating/Feeding Details (indicate cue type and reason): using his fingers at this point Grooming: Wash/dry face, Oral care, Brushing hair, Min guard, Standing Grooming Details (indicate cue type and reason): Pt performing three grooming tasks at sink with Min Guard A. Requiring cues to recall 2/3 grooming tasks Upper Body Dressing : Minimal assistance, Sitting Lower Body Dressing: Min guard, Sit to/from stand Lower Body Dressing Details (indicate cue type and reason): donning pants wiht min guard A for safety Toilet Transfer: Min guard, Ambulation (simulated to recliner) Toileting- Clothing Manipulation and Hygiene: Minimal assistance, Sit to/from stand Functional mobility during ADLs: Min guard General ADL Comments: Pt performing grooming tasks at sink and then functional mobility in hallway. Continue to present wiht decreased attention nad problem solving     Mobility   Overal bed mobility: Modified Independent Bed Mobility: Supine to Sit Supine to sit: Modified independent (Device/Increase time) Sit to supine: Min guard General bed mobility comments: Patient just returned to bed, deferred OOB again.     Transfers   Overall transfer level: Needs assistance Equipment used: None Transfers: Sit to/from Stand Sit to Stand: Min guard Bed to/from chair/wheelchair/BSC transfer type:: Step pivot Step pivot transfers: Min assist, Mod assist General transfer comment: lateral loss of balance with initial sit to stand     Ambulation / Gait / Stairs / Wheelchair Mobility   Ambulation/Gait Ambulation/Gait assistance: Min assist, Mod assist Gait Distance (Feet): 250 Feet Assistive device:  (PRN support of wall or railing) Gait Pattern/deviations: Drifts right/left, Staggering right, Staggering left General Gait Details: pt with multiple lateral losses of balance, drifting toward wall or railing for support. Pt needs modA  to correct 2-3 losses of balance during session Gait velocity: reduced Gait velocity interpretation: <1.8 ft/sec, indicate of risk for recurrent falls     Posture / Balance Dynamic Sitting Balance Sitting balance - Comments: decreased safety Balance Overall balance assessment: Needs assistance Sitting-balance support: No upper extremity supported, Feet supported Sitting balance-Leahy Scale: Fair Sitting balance - Comments: decreased safety Standing balance support: No upper extremity supported, During functional activity Standing balance-Leahy Scale: Fair Standing balance comment: min guard A     Special needs/care consideration Decreased safety awareness Fall risk    Previous Home Environment  Living Arrangements:  (sister and brother  in law)  Lives With: Family Available Help at Discharge: Family, Available 24 hours/day Type of Home: Mobile home Home Layout: One level Home Access: Stairs to enter Entrance Stairs-Rails: Can reach both Entrance Stairs-Number of Steps: 3 Bathroom Shower/Tub: Optometrist: Yes How Accessible: Accessible via walker [place]: No   Discharge Living Setting Plans for Discharge Living Setting: Lives with (comment) (sister and brother in law) Type of Home at Discharge: Mobile home Discharge Home Layout: One level Discharge Home Access: Stairs to enter Entrance Stairs-Rails: Right, Left, Can reach both Entrance Stairs-Number of Steps: 3 Discharge Bathroom Shower/Tub: Tub/shower unit Discharge Bathroom Toilet: Standard Discharge Bathroom Accessibility: Yes How Accessible: Accessible via walker Does the patient have any problems obtaining your medications?: No   Social/Family/Support Systems Contact Information: sister, Judeen Hammans Anticipated Caregiver: sister and brother in law Anticipated Caregiver's Contact Information: see contacts Ability/Limitations of Caregiver: sistr works but  will arrange 24/7 Caregiver Availability: 24/7 Discharge Plan Discussed with Primary Caregiver: Yes Is Caregiver In Agreement with Plan?: Yes Does Caregiver/Family have Issues with Lodging/Transportation while Pt is in Rehab?: No   Goals Patient/Family Goal for Rehab: supervision to intermittent min assist with PT, OT and SLP Expected length of stay: ELOS 10 to 12 days Pt/Family Agrees to Admission and willing to participate: Yes Program Orientation Provided & Reviewed with Pt/Caregiver Including Roles  & Responsibilities: Yes   Progressing rapidly so likely shorter LOS than expected   Decrease burden of Care through IP rehab admission: n/a   Possible need for SNF placement upon discharge: not anticipated   Patient Condition: I have reviewed medical records from [facility], spoken with CM, and patient and family member. I met with patient at the bedside for inpatient rehabilitation assessment.  Patient will benefit from ongoing PT, OT, and SLP, can actively participate in 3 hours of therapy a day 5 days of the week, and can make measurable gains during the admission.  Patient will also benefit from the coordinated team approach during an Inpatient Acute Rehabilitation admission.  The patient will receive intensive therapy as well as Rehabilitation physician, nursing, social worker, and care management interventions.  Due to bladder management, bowel management, safety, skin/wound care, disease management, medication administration, pain management, and patient education the patient requires 24 hour a day rehabilitation nursing.  The patient is currently min  assist overall with mobility and basic ADLs.  Discharge setting and therapy post discharge at home with home health is anticipated.  Patient has agreed to participate in the Acute Inpatient Rehabilitation Program and will admit today.   Preadmission Screen Completed By:  Cleatrice Burke, 01/02/2022 11:14  AM ______________________________________________________________________   Discussed status with Dr. Letta Pate on 01/02/22 at 1114 and received approval for admission today.   Admission Coordinator:  Cleatrice Burke, RN, time  3888 Date 01/02/22    Assessment/Plan: Diagnosis:  Right temporo frontal brain tumor Does the need for close, 24 hr/day Medical supervision in concert with the patient's rehab needs make it unreasonable for this patient to be served in a less intensive setting? Yes Co-Morbidities requiring supervision/potential complications: on seizure prophyllaxis, on Decadron for cerebral edema, monitor CBG Due to bladder management, bowel management, safety, skin/wound care, disease management, medication administration, pain management, and patient education, does the patient require 24 hr/day rehab nursing? Yes Does the patient require coordinated care of a physician, rehab nurse, PT, OT, and SLP to address physical and functional deficits in the context of the  above medical diagnosis(es)? Yes Addressing deficits in the following areas: balance, endurance, locomotion, strength, transferring, bowel/bladder control, bathing, dressing, feeding, grooming, toileting, cognition, speech, language, swallowing, and psychosocial support Can the patient actively participate in an intensive therapy program of at least 3 hrs of therapy 5 days a week? Yes The potential for patient to make measurable gains while on inpatient rehab is good Anticipated functional outcomes upon discharge from inpatient rehab: modified independent and supervision PT, modified independent and supervision OT, modified independent and supervision SLP Estimated rehab length of stay to reach the above functional goals is: 10-12d Anticipated discharge destination: Home 10. Overall Rehab/Functional Prognosis: good     MD Signature: Randy Gallagher M.D. [employer] Fellow Am Acad of Phys Med and  Rehab Diplomate Am Board of Electrodiagnostic Med Fellow Am Board of Interventional Pain           Revision History                          Note Details  Author Randy Blake, MD File Time 01/02/2022 11:34 AM  Author Type Physician Status Signed  Last Editor Randy Blake, MD Service Physical Medicine and Rehabilitation

## 2022-01-03 DIAGNOSIS — G939 Disorder of brain, unspecified: Secondary | ICD-10-CM | POA: Diagnosis not present

## 2022-01-03 LAB — CBC WITH DIFFERENTIAL/PLATELET
Abs Immature Granulocytes: 0.41 10*3/uL — ABNORMAL HIGH (ref 0.00–0.07)
Basophils Absolute: 0.1 10*3/uL (ref 0.0–0.1)
Basophils Relative: 1 %
Eosinophils Absolute: 0.2 10*3/uL (ref 0.0–0.5)
Eosinophils Relative: 1 %
HCT: 51.5 % (ref 39.0–52.0)
Hemoglobin: 16.9 g/dL (ref 13.0–17.0)
Immature Granulocytes: 2 %
Lymphocytes Relative: 13 %
Lymphs Abs: 2.6 10*3/uL (ref 0.7–4.0)
MCH: 29.3 pg (ref 26.0–34.0)
MCHC: 32.8 g/dL (ref 30.0–36.0)
MCV: 89.3 fL (ref 80.0–100.0)
Monocytes Absolute: 1.2 10*3/uL — ABNORMAL HIGH (ref 0.1–1.0)
Monocytes Relative: 6 %
Neutro Abs: 15.8 10*3/uL — ABNORMAL HIGH (ref 1.7–7.7)
Neutrophils Relative %: 77 %
Platelets: 391 10*3/uL (ref 150–400)
RBC: 5.77 MIL/uL (ref 4.22–5.81)
RDW: 14 % (ref 11.5–15.5)
WBC: 20.2 10*3/uL — ABNORMAL HIGH (ref 4.0–10.5)
nRBC: 0 % (ref 0.0–0.2)

## 2022-01-03 LAB — COMPREHENSIVE METABOLIC PANEL
ALT: 40 U/L (ref 0–44)
AST: 18 U/L (ref 15–41)
Albumin: 3 g/dL — ABNORMAL LOW (ref 3.5–5.0)
Alkaline Phosphatase: 67 U/L (ref 38–126)
Anion gap: 8 (ref 5–15)
BUN: 16 mg/dL (ref 6–20)
CO2: 26 mmol/L (ref 22–32)
Calcium: 9.2 mg/dL (ref 8.9–10.3)
Chloride: 103 mmol/L (ref 98–111)
Creatinine, Ser: 0.81 mg/dL (ref 0.61–1.24)
GFR, Estimated: >60 mL/min (ref >60)
Glucose, Bld: 134 mg/dL — ABNORMAL HIGH (ref 70–99)
Potassium: 4.1 mmol/L (ref 3.5–5.1)
Sodium: 137 mmol/L (ref 135–145)
Total Bilirubin: 0.4 mg/dL (ref 0.3–1.2)
Total Protein: 6.2 g/dL — ABNORMAL LOW (ref 6.5–8.1)

## 2022-01-03 NOTE — Progress Notes (Signed)
Patient ID: Randy Gallagher, male   DOB: 16-Sep-1971, 51 y.o.   MRN: [id number] Met with the patient to review rehab process, team conference and plan of care along with discharge process. Discussed situation, medications including keppra and decadron. Patient noted MD discussed hyperglycemia; usually eats a snack at night but has not since in the hospital. Reviewed adding protein with starch to improve metabolism. Reviewed smoking cessation tips and rationale along with dietary modification recommendations. Patient expressed desire to return to work; MD aware, referred questions regarding precautions and limitations for working, driving, etc to MD.  Patient does not have a PCP; reported attempted to sign on with one in [place] [facility] however not taking new patients until July. Patient spoke with friend who recommended MD in [place] as he declines MD based in [place]. Continue to follow along to discharge to address educational needs. Collaborate with the team to facilitate preparation for discharge.Hervey Ard, Dalbert Batman

## 2022-01-03 NOTE — Progress Notes (Signed)
Inpatient Rehabilitation  Patient information reviewed and entered into eRehab system by Ananias Kolander Maryn Freelove, OTR/L.   Information including medical coding, functional ability and quality indicators will be reviewed and updated through discharge.    

## 2022-01-03 NOTE — Plan of Care (Signed)
°  Problem: RH Problem Solving Goal: LTG Patient will demonstrate problem solving for (SLP) Description: LTG:  Patient will demonstrate problem solving for basic/complex daily situations with cues  (SLP) Flowsheets (Taken 01/03/2022 1022) LTG: Patient will demonstrate problem solving for (SLP): (functional tasks)  Complex daily situations  Other (comment) LTG Patient will demonstrate problem solving for: Modified Independent   Problem: RH Memory Goal: LTG Patient will demonstrate ability for day to day (SLP) Description: LTG:   Patient will demonstrate ability for day to day recall/carryover during cognitive/linguistic activities with assist  (SLP) Flowsheets (Taken 01/03/2022 1022) LTG: Patient will demonstrate ability for day to day recall:  New information  Daily complex information LTG: Patient will demonstrate ability for day to day recall/carryover during cognitive/linguistic activities with assist (SLP): Supervision   Problem: RH Attention Goal: LTG Patient will demonstrate this level of attention during functional activites (SLP) Description: LTG:  Patient will will demonstrate this level of attention during functional activites (SLP) 01/03/2022 1024 by Venita Lick, [place], [place] (Taken 01/03/2022 1024) LTG: Patient will demonstrate this level of attention during cognitive/linguistic activities with assistance of (SLP): Modified Independent 01/03/2022 1022 by [place], [place], [place] (Taken 01/03/2022 1022) Patient will demonstrate during cognitive/linguistic activities the attention type of: Selective Patient will demonstrate this level of attention during cognitive/linguistic activities in: Controlled LTG: Patient will demonstrate this level of attention during cognitive/linguistic activities with assistance of (SLP): Supervision   Problem: RH Awareness Goal: LTG: Patient will demonstrate awareness during functional activites type of (SLP) Description: LTG:  Patient will demonstrate awareness during functional activites type of (SLP) Flowsheets (Taken 01/03/2022 1022) Patient will demonstrate during cognitive/linguistic activities awareness type of: Emergent LTG: Patient will demonstrate awareness during cognitive/linguistic activities with assistance of (SLP): Modified Independent   Problem: RH Expression Communication Goal: LTG Patient will increase speech intelligibility (SLP) Description: LTG: Patient will increase speech intelligibility at word/phrase/conversation level with cues, % of the time (SLP) Flowsheets (Taken 01/03/2022 1022) LTG: Patient will increase speech intelligibility (SLP): Modified Independent Level: Conversation level Percent of time patient will use intelligible speech: 90%

## 2022-01-03 NOTE — Evaluation (Signed)
Occupational Therapy Assessment and Plan  Patient Details  Name: Randy Gallagher MRN: [id number] Date of Birth: [birth year]  OT Diagnosis: abnormal posture, cognitive deficits, and muscle weakness (generalized) Rehab Potential:   ELOS: 5-7 days   Today's Date: 01/03/2022 OT Individual Time: 3267-1245 OT Individual Time Calculation (min): 68 min     Hospital Problem: Principal Problem:   Brain lesion Active Problems:   Brain tumor [facility])   Past Medical History: History reviewed. No pertinent past medical history. Past Surgical History:  Past Surgical History:  Procedure Laterality Date   CRANIOTOMY Right 12/27/2021   Procedure: CRANIOTOMY TUMOR EXCISION;  Surgeon: Earnie Larsson, MD;  Location: [place];  Service: Neurosurgery;  Laterality: Right;  brain lab    Assessment & Plan Clinical Impression: Randy Gallagher is a 51 year old right-handed male with history of tobacco use on no prescription medications.  Per chart review lives with his sister.  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  1 level home 3 steps to entry.  Presented 12/26/2021 with progressive headaches retro-orbital worse on the right.  Patient had noted over the last couple of weeks developed a subcutaneous collection in his forehead.  Denied any nausea vomiting or seizure.  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  T2 hyperintense swelling local mass effect and a 1 cm midline shift.  Patient underwent right frontal temporal parietal craniotomy resection of tumor utilizing intraoperative stereotaxis and microdissection 12/27/2021 per Dr. Annette Stable.  MRI follow-up showed no complicating feature after debulking of right superior temporal tumor.  Unchanged vasogenic edema and 9 mm of midline shift.  Maintained on Decadron with slow taper.  Keppra 500 mg twice daily for seizure prophylaxis.  Tolerating a mechanical soft diet.  Therapy evaluations  completed due to patient decreased functional mobility was admitted for a comprehensive rehab program. Patient transferred to CIR on 01/02/2022 .    Patient currently requires  CGA  with basic self-care skills secondary to muscle weakness, decreased cardiorespiratoy endurance, decreased coordination and decreased motor planning, decreased motor planning, decreased attention, decreased awareness, decreased problem solving, decreased safety awareness, and decreased memory, and decreased sitting balance, decreased standing balance, decreased postural control, and decreased balance strategies.  Prior to hospitalization, patient could complete BADL and IADL with independent .  Patient will benefit from skilled intervention to increase independence with basic self-care skills prior to discharge home with care partner.  Anticipate patient will require intermittent supervision and no further OT follow recommended.  OT - End of Session Activity Tolerance: Tolerates 30+ min activity with multiple rests Endurance Deficit: Yes OT Assessment OT Barriers to Discharge: Decreased caregiver support OT Patient demonstrates impairments in the following area(s): Balance;Cognition;Edema;Endurance;Motor;Safety OT Basic ADL's Functional Problem(s): Grooming;Bathing;Dressing;Toileting OT Transfers Functional Problem(s): Toilet;Tub/Shower OT Additional Impairment(s): None OT Plan OT Intensity: Minimum of 1-2 x/day, 45 to 90 minutes OT Frequency: 5 out of 7 days OT Duration/Estimated Length of Stay: 5-7 days OT Treatment/Interventions: Balance/vestibular training;DME/adaptive equipment instruction;Patient/family education;Therapeutic Activities;Therapeutic Exercise;Psychosocial support;Functional electrical stimulation;Cognitive remediation/compensation;Community reintegration;Functional mobility training;Self Care/advanced ADL retraining;UE/LE Strength taining/ROM;UE/LE Coordination activities;Skin care/wound  managment;Neuromuscular re-education;Discharge planning;Disease mangement/prevention;Pain management;Visual/perceptual remediation/compensation OT Self Feeding Anticipated Outcome(s): no goal OT Basic Self-Care Anticipated Outcome(s): Set up - Supervision OT Toileting Anticipated Outcome(s): Mod I OT Bathroom Transfers Anticipated Outcome(s): Set up - Supervision OT Recommendation Patient destination: Home Follow Up Recommendations: None Equipment Recommended: To be determined   OT Evaluation Precautions/Restrictions  Precautions Precautions: Fall Precaution Comments: decreased safety awareness,  R crani Restrictions Weight Bearing Restrictions: No Pain Pain Assessment Pain Scale: 0-10 Pain Score: 0-No pain Home Living/Prior Functioning Home Living Family/patient expects to be discharged to:: Private residence Living Arrangements: Spouse/significant other Available Help at Discharge: Family, Available 24 hours/day (per chart) Type of Home: Mobile home Home Access: Stairs to enter [employer] of Steps: 3 Entrance Stairs-Rails: Can reach both Home Layout: One level Bathroom Shower/Tub: Optometrist: Yes  Lives With: Family IADL History Occupation: Full time employment 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 Prior Function Level of Independence: Independent with basic ADLs, Independent with homemaking with ambulation, Independent with gait, Independent with transfers  Able to Take Stairs?: Yes Vocation: Full time employment Vision Baseline Vision/History: 0 No visual deficits Ability to See in Adequate Light: 0 Adequate Patient Visual Report: No change from baseline Vision Assessment?: No apparent visual deficits Additional Comments: able to track all quadrants, no reports of visual changes Perception  Perception: Within Functional Limits Praxis Praxis: Intact Cognition Overall Cognitive  Status: Impaired/Different from baseline Arousal/Alertness: Awake/alert Orientation Level: Person;Place;Situation Person: Oriented Place: Oriented Situation: Oriented Year: 2023 Month: February Day of Week: Correct Memory: Impaired Memory Impairment: Retrieval deficit;Decreased recall of new information Immediate Memory Recall: Sock;Blue;Bed Memory Recall Sock: Without Cue Memory Recall Blue: Without Cue Memory Recall Bed: Without Cue Awareness: Impaired Awareness Impairment: Emergent impairment Problem Solving: Impaired Sequencing: Impaired Safety/Judgment: Impaired Comments: decreased safety awareness during mobility and ADL tasks Sensation Sensation Light Touch: Appears Intact Coordination Gross Motor Movements are Fluid and Coordinated: No Fine Motor Movements are Fluid and Coordinated: Yes Coordination and Movement Description: grossly uncoordinated 2/2 weakness, mild sway with mobility Finger Nose Finger Test: [facility] Motor  Motor Motor: Other (comment) Motor - Skilled Clinical Observations: limited 2/2 weakness, truncal sway with mobility  Trunk/Postural Assessment  Cervical Assessment Cervical Assessment: Exceptions to [facility] (FWD head) Thoracic Assessment Thoracic Assessment: Exceptions to [facility] (rounded shoulders) Lumbar Assessment Lumbar Assessment: Within Functional Limits Postural Control Postural Control: Deficits on evaluation  Balance Balance Balance Assessed: Yes Dynamic Sitting Balance Dynamic Sitting - Balance Support: During functional activity Dynamic Sitting - Level of Assistance: 5: Stand by assistance Dynamic Sitting - Balance Activities: Lateral lean/weight shifting;Forward lean/weight shifting;Reaching across midline;Reaching for objects Static Standing Balance Static Standing - Balance Support: During functional activity Static Standing - Level of Assistance: 5: Stand by assistance Dynamic Standing Balance Dynamic Standing - Balance Support:  During functional activity Dynamic Standing - Level of Assistance: 5: Stand by assistance;4: Min assist Dynamic Standing - Balance Activities: Lateral lean/weight shifting;Forward lean/weight shifting;Reaching for objects Dynamic Standing - Comments: 1x mild LOB with functional mobility in room, Min guard to recover Extremity/Trunk Assessment RUE Assessment RUE Assessment: Within Functional Limits LUE Assessment LUE Assessment: Exceptions to [facility] Active Range of Motion (AROM) Comments: WNL General Strength Comments: WFL but slightly weaker compared to R, pt reporting this is dominant side. Grossly 4/5  Care Tool Care Tool Self Care Eating    Set up    Oral Care    Oral Care Assist Level: Set up assist    Bathing   Body parts bathed by patient: Right arm;Left arm;Chest;Abdomen;Front perineal area;Buttocks;Right upper leg;Left upper leg;Face;Left lower leg;Right lower leg (simulated reaching LB with CGA)     Assist Level: Contact Guard/Touching assist    Upper Body Dressing(including orthotics)   What is the patient wearing?: Pull over shirt   Assist Level: Set up assist    Lower Body Dressing (excluding  footwear)   What is the patient wearing?: Pants Assist for lower body dressing: Contact Guard/Touching assist    Putting on/Taking off footwear   What is the patient wearing?: Shoes Assist for footwear: Set up assist       Care Tool Toileting Toileting activity   Assist for toileting: Supervision/Verbal cueing (simulated)     Care Tool Bed Mobility Roll left and right activity    Supervision    Sit to lying activity    Supervision    Lying to sitting on side of bed activity   Lying to sitting on side of bed assist level: the ability to move from lying on the back to sitting on the side of the bed with no back support.: Supervision/Verbal cueing     Care Tool Transfers Sit to stand transfer   Sit to stand assist level: Contact Guard/Touching assist    Chair/bed  transfer   Chair/bed transfer assist level: Contact Guard/Touching assist     Toilet transfer   Assist Level: Contact Guard/Touching assist     Care Tool Cognition  Expression of Ideas and Wants Expression of Ideas and Wants: 4. Without difficulty (complex and basic) - expresses complex messages without difficulty and with speech that is clear and easy to understand  Understanding Verbal and Non-Verbal Content Understanding Verbal and Non-Verbal Content: 3. Usually understands - understands most conversations, but misses some part/intent of message. Requires cues at times to understand   Memory/Recall Ability Memory/Recall Ability : Current season;That he or she is in a hospital/hospital unit   Refer to Care Plan for [place] 1 OT Short Term Goal 1 (Week 1): STG = LTG 2/2 LOS  Recommendations for other services: None    Skilled Therapeutic Intervention ADL ADL Eating: Not assessed Grooming: Supervision/safety Where Assessed-Grooming: Edge of bed Upper Body Bathing: Supervision/safety (standing at sink) Where Assessed-Upper Body Bathing: Standing at sink Lower Body Bathing: Contact guard (simulated) Where Assessed-Lower Body Bathing: Standing at sink Upper Body Dressing: Setup Lower Body Dressing: Contact guard Where Assessed-Lower Body Dressing: Edge of bed Toileting: Contact guard Toilet Transfer: Contact guard Mobility  Bed Mobility Bed Mobility: Left Sidelying to Sit;Rolling Left;Supine to Sit Rolling Left: Supervision/Verbal cueing Left Sidelying to Sit: Supervision/Verbal cueing Supine to Sit: Supervision/Verbal cueing Transfers Sit to Stand: Contact Guard/Touching assist Stand to Sit: Contact Guard/Touching assist   Skilled Intervention: Pt greeted at time of session semireclined in bed resting agreeable to OT eval and session. No pain reported. Discussed with pt regarding purpose and plan of OT. Pt preferring to perform sink level  bathing at this time. Initial part of session locating and retrieving scrub clothes from 4th floor as there were none on 5C. Pt ambulating throughout room with no AD and CGA, standing at sink for bathing tasks with CGA approx 10-15 minutes no rest break. Standing as well for oral hygiene and grooming tasks, washing hair with wash cloths only, cues to avoid staples. Seated UB/LB dressing with CGA overall, able to don shoes as well with set up. Ambulating approx 150+ feet no AD throughout 5C with 1 mild LOB and pt able to recover with Min guard. Back in room, set up with alarm on call bell in reach. Initially resistant to alarm, but eventually willing to allow.    Discharge Criteria: Patient will be discharged from OT if patient refuses treatment 3 consecutive times without medical reason, if treatment goals not met, if there is a change in  medical status, if patient makes no progress towards goals or if patient is discharged from hospital.  The above assessment, treatment plan, treatment alternatives and goals were discussed and mutually agreed upon: by patient  Viona Gilmore 01/03/2022, 9:00 AM

## 2022-01-03 NOTE — Progress Notes (Signed)
Inpatient Rehabilitation Care Coordinator Assessment and Plan Patient Details  Name: Randy Gallagher MRN: [id number] Date of Birth: Sep 20, 1971  Today's Date: 01/03/2022  Hospital Problems: Principal Problem:   Brain lesion Active Problems:   Brain tumor [facility])  Past Medical History: History reviewed. No pertinent past medical history. Past Surgical History:  Past Surgical History:  Procedure Laterality Date   CRANIOTOMY Right 12/27/2021   Procedure: CRANIOTOMY TUMOR EXCISION;  Surgeon: Earnie Larsson, MD;  Location: [place];  Service: Neurosurgery;  Laterality: Right;  brain lab   Social History:  reports that he has been smoking cigarettes. He has been smoking an average of 1 pack per day. He has never used smokeless tobacco. He reports that he does not currently use drugs. He reports that he does not drink alcohol.  Family / Support Systems Marital Status: Divorced Patient Roles: Other (Comment) (sibling and employee) Other Supports: Environmental consultant [phone] Brother in-law Anticipated Caregiver: sister and brother in-law Ability/Limitations of Caregiver: Sister works and brother in-law has had a CVA and sometimes works. At times may be alone Caregiver Availability: Other (Comment) (Mostly 24/7 but times will be alone) Family Dynamics: Close with sister and brother in-law whom pt lives with. Pt seems to feel he can manage on his own and does not need 24/7 care  Social History Preferred language: English Religion:  Cultural Background: No issues Education: [place] - How often do you need to have someone help you when you read instructions, pamphlets, or other written material from your doctor or pharmacy?: Never Writes: Yes Employment Status: Employed Name of Employer: Patent attorney Return to Work Plans: Hopes to return to work feels doing well. Encouraged him to talk with the MD Legal History/Current Legal Issues: No issues Guardian/Conservator: None-according to MD pt  is capable of making his own decisions while here. Will include sister since pt may have some deficits from his surgery   Abuse/Neglect Abuse/Neglect Assessment Can Be Completed: Yes Physical Abuse: Denies Verbal Abuse: Denies Sexual Abuse: Denies Exploitation of patient/patient's resources: Denies Self-Neglect: Denies  Patient response to: Social Isolation - How often do you feel lonely or isolated from those around you?: Never  Emotional Status Pt's affect, behavior and adjustment status: Pt is motivated to do well and feels he will be ready by Friday to go home. He wants to go to the beach this weekend. He has always been independent and taken care of himself, he plans to again Recent Psychosocial Issues: Feels was doing well until this happened and his headaches started Psychiatric History: No history seems to be high level and will be short length of stay here Substance Abuse History: Tobacco aware of the health affects and will decide whether he needs to quit or not  Patient / Family Perceptions, Expectations & Goals Pt/Family understanding of illness & functional limitations: Pt can explain his brain surgery anr the tumor he has, he hopes they got it all but has not been told anything about this. Encouraged him to talk with the MD regarding results of the path etc Premorbid pt/family roles/activities: employee, sibling, friend, etc Anticipated changes in roles/activities/participation: resume Pt/family expectations/goals: Pt states: " I hope to be out of here by Friday, I think I am doing well enough."  [employer]: None Premorbid Home Care/DME Agencies: None Transportation available at discharge: Pt drove PTA and sister drives Is the patient able to respond to transportation needs?: Yes In the past 12 months, has lack of transportation kept you from  medical appointments or from getting medications?: No In the past 12 months, has lack of transportation  kept you from meetings, work, or from getting things needed for daily living?: No Resource referrals recommended: Neuropsychology  Discharge Planning Living Arrangements: Other relatives Support Systems: Other relatives, Friends/neighbors Type of Residence: Private residence Insurance Resources: Multimedia programmer (specify) Nurse, mental health) Financial Resources: Employment, Secondary school teacher Screen Referred: No Living Expenses: Lives with family Money Management: Patient, Family Does the patient have any problems obtaining your medications?: No (has no PCP) Home Management: Sister and pt helps Patient/Family Preliminary Plans: Return home with sister and brother in-law sister works and brother in-law has had a history of CVA and sometimes works. At times pt may be alone, hopefully will be mod/i so will be safe when he is alone. Pt hopes to return back to work. Care Coordinator Barriers to Discharge: Insurance for SNF coverage, Decreased caregiver support Care Coordinator Anticipated Follow Up Needs: HH/OP  Clinical Impression Pleasant gentleman who is motivated to do well and return home by this weekend. His sister and brother in-law are supportive and will assist, will be alone at times. Will await team evaluations and work on discharge needs. Will let team know pt wants o go home by this weekend.  [name] 01/03/2022, 9:17 AM

## 2022-01-03 NOTE — Plan of Care (Signed)
°  Problem: RH Balance Goal: LTG Patient will maintain dynamic sitting balance (PT) Description: LTG:  Patient will maintain dynamic sitting balance with assistance during mobility activities (PT) Flowsheets (Taken 01/03/2022 1904) LTG: Pt will maintain dynamic sitting balance during mobility activities with:: Independent with assistive device  Goal: LTG Patient will maintain dynamic standing balance (PT) Description: LTG:  Patient will maintain dynamic standing balance with assistance during mobility activities (PT) Flowsheets (Taken 01/03/2022 1904) LTG: Pt will maintain dynamic standing balance during mobility activities with:: Supervision/Verbal cueing   Problem: Sit to Stand Goal: LTG:  Patient will perform sit to stand with assistance level (PT) Description: LTG:  Patient will perform sit to stand with assistance level (PT) Flowsheets (Taken 01/03/2022 1904) LTG: PT will perform sit to stand in preparation for functional mobility with assistance level: Independent with assistive device   Problem: RH Bed Mobility Goal: LTG Patient will perform bed mobility with assist (PT) Description: LTG: Patient will perform bed mobility with assistance, with/without cues (PT). Flowsheets (Taken 01/03/2022 1904) LTG: Pt will perform bed mobility with assistance level of: Independent with assistive device    Problem: RH Bed to Chair Transfers Goal: LTG Patient will perform bed/chair transfers w/assist (PT) Description: LTG: Patient will perform bed to chair transfers with assistance (PT). Flowsheets (Taken 01/03/2022 1904) LTG: Pt will perform Bed to Chair Transfers with assistance level: Independent with assistive device    Problem: RH Car Transfers Goal: LTG Patient will perform car transfers with assist (PT) Description: LTG: Patient will perform car transfers with assistance (PT). Flowsheets (Taken 01/03/2022 1904) LTG: Pt will perform car transfers with assist:: Supervision/Verbal cueing    Problem: RH Ambulation Goal: LTG Patient will ambulate in controlled environment (PT) Description: LTG: Patient will ambulate in a controlled environment, # of feet with assistance (PT). Flowsheets (Taken 01/03/2022 1904) LTG: Pt will ambulate in controlled environ  assist needed:: Supervision/Verbal cueing LTG: Ambulation distance in controlled environment: 350ft using LRAD Goal: LTG Patient will ambulate in home environment (PT) Description: LTG: Patient will ambulate in home environment, # of feet with assistance (PT). Flowsheets (Taken 01/03/2022 1904) LTG: Pt will ambulate in home environ  assist needed:: Independent with assistive device LTG: Ambulation distance in home environment: 59ft using LRAD   Problem: RH Stairs Goal: LTG Patient will ambulate up and down stairs w/assist (PT) Description: LTG: Patient will ambulate up and down # of stairs with assistance (PT) Flowsheets (Taken 01/03/2022 1904) LTG: Pt will ambulate up/down stairs assist needed:: Supervision/Verbal cueing LTG: Pt will  ambulate up and down number of stairs: 4 steps using B HRs
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
## 2022-01-03 NOTE — Progress Notes (Signed)
Patient ID: Randy Gallagher, male   DOB: 30-Aug-1971, 51 y.o.   MRN: [id number]  Met with pt to update him regarding team conference goals of supervision-mod/I level and target discharge date of 3/4. He is glad to be going home on Sat. Tried to update his sister but her voice mailbox is full. Will work on discharge needs.

## 2022-01-03 NOTE — Progress Notes (Signed)
PROGRESS NOTE   Subjective/Complaints: Hopes to leave by Friday- I have sent a message to the team- I think this would be reasonable. Walking 200 feet Min to Mod A Reviewed labs with him  ROS: denies pain   Objective:   No results found. Recent Labs    01/03/22 0504  WBC 20.2*  HGB 16.9  HCT 51.5  PLT 391   Recent Labs    01/03/22 0504  NA 137  K 4.1  CL 103  CO2 26  GLUCOSE 134*  BUN 16  CREATININE 0.81  CALCIUM 9.2    Intake/Output Summary (Last 24 hours) at 01/03/2022 1057 Last data filed at 01/03/2022 0800 Gross per 24 hour  Intake 240 ml  Output 400 ml  Net -160 ml        Physical Exam: Vital Signs Blood pressure 110/70, pulse 62, temperature 98.6 F (37 C), resp. rate 16, height 5' 10.5" (1.791 m), weight 82.7 kg, SpO2 97 %. Gen: no distress, normal appearing HENT:     Head:     Comments: Craniotomy site clean and dry.  Subcutaneous fluid collection consistent with subcutaneous cyst mid frontal region Cardio: Reg rate Chest: normal effort, normal rate of breathing Abd: soft, non-distended Ext: no edema Psych: pleasant, normal affect Skin: intact Neurological:     Comments: Patient is alert.  Makes eye contact with examiner.  Mood is a bit flat.  Provides name age and date of birth with some mild delay in processing. Cranial nerves II through XII intact, motor strength is 5/5 in bilateral deltoid, bicep, tricep, grip, hip flexor, knee extensors, ankle dorsiflexor and plantar flexor Sensory exam normal sensation to light touch and proprioception in bilateral upper and lower extremities   Musculoskeletal: Full range of motion in all 4 extremities. No joint swelling   Assessment/Plan: 1. Functional deficits which require 3+ hours per day of interdisciplinary therapy in a comprehensive inpatient rehab setting. Physiatrist is providing close team supervision and 24 hour management of active  medical problems listed below. Physiatrist and rehab team continue to assess barriers to discharge/monitor patient progress toward functional and medical goals  Care Tool:  Bathing    Body parts bathed by patient: Right arm, Left arm, Chest, Abdomen, Front perineal area, Buttocks, Right upper leg, Left upper leg, Face, Left lower leg, Right lower leg (simulated reaching LB with CGA)         Bathing assist Assist Level: Contact Guard/Touching assist     Upper Body Dressing/Undressing Upper body dressing   What is the patient wearing?: Pull over shirt    Upper body assist Assist Level: Set up assist    Lower Body Dressing/Undressing Lower body dressing      What is the patient wearing?: Pants     Lower body assist Assist for lower body dressing: Contact Guard/Touching assist     Toileting Toileting    Toileting assist Assist for toileting: Supervision/Verbal cueing (simulated)     Transfers Chair/bed transfer  Transfers assist     Chair/bed transfer assist level: Contact Guard/Touching assist     Locomotion Ambulation   Ambulation assist  Walk 10 feet activity   Assist           Walk 50 feet activity   Assist           Walk 150 feet activity   Assist           Walk 10 feet on uneven surface  activity   Assist           Wheelchair     Assist               Wheelchair 50 feet with 2 turns activity    Assist            Wheelchair 150 feet activity     Assist          Blood pressure 110/70, pulse 62, temperature 98.6 F (37 C), resp. rate 16, height 5' 10.5" (1.791 m), weight 82.7 kg, SpO2 97 %.  Medical Problem List and Plan: 1. 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  Status post right frontal temporoparietal craniotomy resection of tumor 12/27/2021.  Follow-up per neurosurgery.  Slow taper of Decadron.             -patient may  shower with shower  cap             -ELOS/Goals: 4 days  Messaged team to let them know he would like to leave by Friday.  2.  Antithrombotics: -DVT/anticoagulation:  Mechanical: Antiembolism stockings, thigh (TED hose) Bilateral lower extremities.               -antiplatelet therapy: N/A 3. Pain Management: Hydrocodone as needed. 4. Mood: Provide emotional support             -antipsychotic agents: N/A 5. Neuropsych: This patient is capable of making decisions on his own behalf. 6. Skin/Wound Care: Routine skin checks 7. Fluids/Electrolytes/Nutrition: Routine in and outs with follow-up chemistries 8.  Seizure prophylaxis.  Keppra 500 mg twice daily 9.  Tobacco abuse.  NicoDerm patch.  Provide counseling 10. Hyperglycemia: advised avoiding added sugar in diet 11. Hypoalbuminemia: advised high protein diet to promote wound healing.  12. Overweight: BMI 25.79: provide dietary education    LOS: 1 days A FACE TO FACE EVALUATION WAS PERFORMED  Randy Gallagher 01/03/2022, 10:57 AM

## 2022-01-03 NOTE — Progress Notes (Signed)
[facility] Individual Statement of Services  Patient Name:  Randy Gallagher  Date:  01/03/2022  Welcome to the [facility].  Our goal is to provide you with an individualized program based on your diagnosis and situation, designed to meet your specific needs.  With this comprehensive rehabilitation program, you will be expected to participate in at least 3 hours of rehabilitation therapies Monday-Friday, with modified therapy programming on the weekends.  Your rehabilitation program will include the following services:  Physical Therapy (PT), Occupational Therapy (OT), Speech Therapy (ST), 24 hour per day rehabilitation nursing, Therapeutic Recreaction (TR), Neuropsychology, Care Coordinator, Rehabilitation Medicine, Nutrition Services, and Pharmacy Services  Weekly team conferences will be held on Tuesday to discuss your progress.  Your Inpatient Rehabilitation Care Coordinator will talk with you frequently to get your input and to update you on team discussions.  Team conferences with you and your family in attendance may also be held.  Expected length of stay: 5-7 days  Overall anticipated outcome: supervision level  Depending on your progress and recovery, your program may change. Your Inpatient Rehabilitation Care Coordinator will coordinate services and will keep you informed of any changes. Your Inpatient Rehabilitation Care Coordinator's name and contact numbers are listed  below.  The following services may also be recommended but are not provided by the [facility] will be made to provide these services after discharge if needed.  Arrangements include referral to agencies that provide these services.  Your insurance has been verified to be:  [employer] Your primary doctor is:  None  Pertinent  information will be shared with your doctor and your insurance company.  Inpatient Rehabilitation Care Coordinator:  Ovidio Kin, [place] or Emilia Beck  Information discussed with and copy given to patient by: [name], 01/03/2022, 9:19 AM

## 2022-01-03 NOTE — Discharge Instructions (Addendum)
Inpatient Rehab Discharge Instructions  Mohamedamin Nifong Discharge date and time: No discharge date for patient encounter.   Activities/Precautions/ Functional Status: Activity: activity as tolerated Diet: regular diet Wound Care: Routine skin checks Functional status:  ___ No restrictions     ___ Walk up steps independently ___ 24/7 supervision/assistance   ___ Walk up steps with assistance ___ Intermittent supervision/assistance  ___ Bathe/dress independently ___ Walk with walker     __x_ Bathe/dress with assistance ___ Walk Independently    ___ Shower independently ___ Walk with assistance    ___ Shower with assistance ___ No alcohol     ___ Return to work/school ________  Special Instructions:  No driving smoking or alcohol    COMMUNITY REFERRALS UPON DISCHARGE:     Outpatient: PT OT  SP             Agency:[facility] [place] [id number] Phone:[phone]              Appointment Date/Time: WILL CALL TO ARRANGE APPOINTMENTS  Medical Equipment/Items Ordered: NONE RECOMMENDED                                                 Agency/Supplier:NA   My questions have been answered and I understand these instructions. I will adhere to these goals and the provided educational materials after my discharge from the hospital.  Patient/Caregiver Signature _______________________________ Date __________  Clinician Signature _______________________________________ Date __________  Please bring this form and your medication list with you to all your follow-up doctor's appointments.

## 2022-01-03 NOTE — Evaluation (Signed)
Physical Therapy Assessment and Plan  Patient Details  Name: Randy Gallagher MRN: [id number] Date of Birth: November 09, 1970  PT Diagnosis: Abnormality of gait, Cognitive deficits, Difficulty walking, Muscle weakness, and Pain in head Rehab Potential: Good ELOS: ~5-7 days   Today's Date: 01/03/2022 PT Individual Time: 1112-1207 PT Individual Time Calculation (min): 55 min    Hospital Problem: Principal Problem:   Brain lesion Active Problems:   Brain tumor [facility])   Past Medical History: History reviewed. No pertinent past medical history. Past Surgical History:  Past Surgical History:  Procedure Laterality Date   CRANIOTOMY Right 12/27/2021   Procedure: CRANIOTOMY TUMOR EXCISION;  Surgeon: Earnie Larsson, MD;  Location: [place];  Service: Neurosurgery;  Laterality: Right;  brain lab    Assessment & Plan Clinical Impression: Patient is a 51 y.o. year old  right-handed male with history of tobacco use on no prescription medications.  Per chart review lives with his sister.  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  1 level home 3 steps to entry.  Presented 12/26/2021 with progressive headaches retro-orbital worse on the right.  Patient had noted over the last couple of weeks developed a subcutaneous collection in his forehead.  Denied any nausea vomiting or seizure.  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  T2 hyperintense swelling local mass effect and a 1 cm midline shift.  Patient underwent right frontal temporal parietal craniotomy resection of tumor utilizing intraoperative stereotaxis and microdissection 12/27/2021 per Dr. Annette Stable.  MRI follow-up showed no complicating feature after debulking of right superior temporal tumor.  Unchanged vasogenic edema and 9 mm of midline shift.  Maintained on Decadron with slow taper.  Keppra 500 mg twice daily for seizure prophylaxis.  Tolerating a mechanical soft diet.   Therapy evaluations completed due to patient decreased functional mobility was admitted for a comprehensive rehab program. Patient transferred to CIR on 01/02/2022 .   Patient currently requires min assist with mobility secondary to muscle weakness, decreased cardiorespiratoy endurance,  , decreased awareness, decreased problem solving, decreased safety awareness, and decreased memory, and decreased standing balance, decreased postural control, and decreased balance strategies.  Prior to hospitalization, patient was independent  with mobility and lived with Family (lives with sister and brother-in-law) in a Mobile home home.  Home access is 3-4Stairs to enter.  Patient will benefit from skilled PT intervention to maximize safe functional mobility, minimize fall risk, and decrease caregiver burden for planned discharge home with 24 hour supervision.  Anticipate patient will benefit from follow up OP at discharge.  PT - End of Session Activity Tolerance: Tolerates 30+ min activity with multiple rests Endurance Deficit: Yes Endurance Deficit Description: requires intermittent seated rest breaks PT Assessment Rehab Potential (ACUTE/IP ONLY): Good PT Barriers to Discharge: Decreased caregiver support;Behavior PT Barriers to Discharge Comments: decreased safety awareness PT Patient demonstrates impairments in the following area(s): Balance;Behavior;Safety;Endurance;Skin Integrity;Motor;Pain;Nutrition;Edema PT Transfers Functional Problem(s): Bed Mobility;Bed to Chair;Car;Furniture;Floor PT Locomotion Functional Problem(s): Ambulation;Stairs PT Plan PT Intensity: Minimum of 1-2 x/day ,45 to 90 minutes PT Frequency: 5 out of 7 days PT Duration Estimated Length of Stay: ~5-7 days PT Treatment/Interventions: Ambulation/gait training;Community reintegration;DME/adaptive equipment instruction;Neuromuscular re-education;Psychosocial support;Stair training;UE/LE Strength taining/ROM;Balance/vestibular  training;Discharge planning;Pain management;Therapeutic Activities;Skin care/wound management;UE/LE Coordination activities;Cognitive remediation/compensation;Disease management/prevention;Functional mobility training;Patient/family education;Therapeutic Exercise;Visual/perceptual remediation/compensation PT Transfers Anticipated Outcome(s): mod-I PT Locomotion Anticipated Outcome(s): supervision PT Recommendation Follow Up Recommendations: Outpatient PT;24 hour supervision/assistance Patient destination: Home Equipment Recommended: To be determined  PT Evaluation Precautions/Restrictions Precautions Precautions: Fall Precaution Comments: decreased safety awareness, R crani Restrictions Weight Bearing Restrictions: No Pain Pain Assessment Pain Scale: 0-10 Pain Score: 4  (states "hurting like the dickens" - rated as 4/10) Pain Location: Head (R head near tempal) Pain Orientation: Right Pain Descriptors / Indicators: Other (Comment);Nagging (states he feels it is from where he rolled over onto his R side last night and then pushed down on it with his hand causing a "pop" sound like "when folding cardboard" - states "after about 84minutes or so it finally straightened out") Pain Onset: On-going Pain Intervention(s): Rest;Relaxation;Distraction;Environmental changes Pain Interference Pain Interference Pain Effect on Sleep: 3. Frequently;2. Occasionally Pain Interference with Therapy Activities: 1. Rarely or not at all Pain Interference with Day-to-Day Activities: 2. Occasionally Home Living/Prior Functioning Home Living Living Arrangements: Other relatives Available Help at Discharge: Family;Available PRN/intermittently (sister works part time a few hours each morning, brother-in-law works part time with friend) Type of Home: Mobile home Home Access: Stairs to enter Technical brewer of Steps: 3-4 Entrance Stairs-Rails: Right;Left;Can reach both Home Layout: One level Bathroom  Shower/Tub: Chiropodist: Standard Bathroom Accessibility: Yes  Lives With: Family (lives with sister and brother-in-law) Prior Function Level of Independence: Independent with gait;Independent with homemaking with ambulation;Independent with transfers  Able to Take Stairs?: Yes Driving: Yes Vocation: Full time employment Vocation Requirements: Games developer Vision/Perception  Vision - History Ability to See in Adequate Light: 0 Adequate Vision - Assessment Additional Comments: able to track all quadrants, no reports of visual changes Perception Perception: Within Functional Limits  Praxis Praxis: Intact  Cognition Overall Cognitive Status: Impaired/Different from baseline Arousal/Alertness: Awake/alert Orientation Level: Oriented X4 Year: 2023 Month: February Day of Week: Correct Attention: Focused;Sustained;Selective Focused Attention: Appears intact Sustained Attention: Appears intact Selective Attention: Impaired Memory: Impaired Awareness: Impaired Problem Solving: Impaired Safety/Judgment: Impaired Comments: decreased safety awareness during mobility and ADL tasks Sensation Sensation Light Touch: Appears Intact Hot/Cold: Not tested Proprioception: Appears Intact Stereognosis: Not tested Coordination Gross Motor Movements are Fluid and Coordinated: No Coordination and Movement Description: grossly uncoordinated due to lateral postural sway during mobility Motor  Motor Motor: Other (comment) Motor - Skilled Clinical Observations: impaired due to postural sway with mobility and impaired balance   Trunk/Postural Assessment  Cervical Assessment Cervical Assessment: Exceptions to [facility] (slight forward head) Thoracic Assessment Thoracic Assessment: Exceptions to [facility] (slight rounding in shoulders) Lumbar Assessment Lumbar Assessment: Within Functional Limits Postural Control Postural Control: Deficits on evaluation Protective Responses:  delayed but sufficient for minor LOB  Balance  Balance Balance Assessed: Yes Standardized Balance Assessment Standardized Balance Assessment: Functional Gait Assessment Static Sitting Balance Static Sitting - Balance Support: Feet supported Static Sitting - Level of Assistance: 5: Stand by assistance Dynamic Sitting Balance Dynamic Sitting - Balance Support: During functional activity Dynamic Sitting - Level of Assistance: 5: Stand by assistance Static Standing Balance Static Standing - Balance Support: During functional activity Static Standing - Level of Assistance: 5: Stand by assistance;Other (comment) (CGA) Dynamic Standing Balance Dynamic Standing - Balance Support: During functional activity Dynamic Standing - Level of Assistance: Other (comment);4: Min assist (CGA) Functional Gait  Assessment Gait assessed : Yes Gait Level Surface: Walks 20 ft in less than 7 sec but greater than 5.5 sec, uses assistive device, slower speed, mild gait deviations, or deviates 6-10 in outside of the 12 in walkway width. Change in Gait Speed: Able to change speed, demonstrates mild gait deviations, deviates 6-10 in outside of the 12 in walkway  width, or no gait deviations, unable to achieve a major change in velocity, or uses a change in velocity, or uses an assistive device. Gait with Horizontal Head Turns: Performs head turns smoothly with slight change in gait velocity (eg, minor disruption to smooth gait path), deviates 6-10 in outside 12 in walkway width, or uses an assistive device. Gait with Vertical Head Turns: Performs task with slight change in gait velocity (eg, minor disruption to smooth gait path), deviates 6 - 10 in outside 12 in walkway width or uses assistive device Gait and Pivot Turn: Pivot turns safely in greater than 3 sec and stops with no loss of balance, or pivot turns safely within 3 sec and stops with mild imbalance, requires small steps to catch balance. Step Over Obstacle: Is  able to step over 2 stacked shoe boxes taped together (9 in total height) without changing gait speed. No evidence of imbalance. Gait with Narrow Base of Support: Ambulates 7-9 steps. Gait with Eyes Closed: Cannot walk 20 ft without assistance, severe gait deviations or imbalance, deviates greater than 15 in outside 12 in walkway width or will not attempt task. Ambulating Backwards: Walks 20 ft, uses assistive device, slower speed, mild gait deviations, deviates 6-10 in outside 12 in walkway width. Steps: Alternating feet, must use rail. Total Score: 19 Extremity Assessment      RLE Assessment RLE Assessment: Within Functional Limits Active Range of Motion (AROM) Comments: WFL/WNL General Strength Comments: grossly 5/5 throughout LLE Assessment LLE Assessment: Exceptions to [facility] Active Range of Motion (AROM) Comments: WFL/WNL LLE Strength Left Hip Flexion: 5/5 Left Knee Flexion: 4+/5 Left Knee Extension: 5/5 Left Ankle Dorsiflexion: 5/5 Left Ankle Plantar Flexion: 5/5  Care Tool Care Tool Bed Mobility Roll left and right activity   Roll left and right assist level: Independent    Sit to lying activity   Sit to lying assist level: Supervision/Verbal cueing    Lying to sitting on side of bed activity   Lying to sitting on side of bed assist level: the ability to move from lying on the back to sitting on the side of the bed with no back support.: Supervision/Verbal cueing     Care Tool Transfers Sit to stand transfer   Sit to stand assist level: Contact Guard/Touching assist    Chair/bed transfer   Chair/bed transfer assist level: Contact Guard/Touching assist     Physiological scientist transfer assist level: Contact Guard/Touching assist      Care Tool Locomotion Ambulation   Assist level: Minimal Assistance - Patient > 75% Assistive device: No Device Max distance: 1,055ft  Walk 10 feet activity   Assist level: Minimal Assistance - Patient >  75% Assistive device: No Device   Walk 50 feet with 2 turns activity   Assist level: Minimal Assistance - Patient > 75% Assistive device: No Device  Walk 150 feet activity   Assist level: Minimal Assistance - Patient > 75% Assistive device: No Device  Walk 10 feet on uneven surfaces activity   Assist level: Minimal Assistance - Patient > 75%    Stairs   Assist level: Minimal Assistance - Patient > 75% Stairs assistive device: 2 hand rails Max number of stairs: 12  Walk up/down 1 step activity   Walk up/down 1 step (curb) assist level: Minimal Assistance - Patient > 75% Walk up/down 1 step or curb assistive device: 2 hand rails  Walk up/down 4 steps activity  Walk up/down 4 steps assist level: Minimal Assistance - Patient > 75% Walk up/down 4 steps assistive device: 2 hand rails  Walk up/down 12 steps activity   Walk up/down 12 steps assist level: Minimal Assistance - Patient > 75% Walk up/down 12 steps assistive device: 2 hand rails  Pick up small objects from floor   Pick up small object from the floor assist level: Minimal Assistance - Patient > 75%;Contact Guard/Touching assist Pick up small object from the floor assistive device: N/A  Wheelchair Is the patient using a wheelchair?: No          Wheel 50 feet with 2 turns activity      Wheel 150 feet activity        Refer to Care Plan for Long Term Goals  SHORT TERM GOAL WEEK 1 PT Short Term Goal 1 (Week 1): = to LTGs based on ELOS  Recommendations for other services: None   Skilled Therapeutic Intervention Pt received sitting in recliner and agreeable to therapy session. Evaluation completed (see details above) with patient education regarding purpose of PT evaluation, PT POC and goals, therapy schedule, weekly team meetings, and other CIR information including safety plan and fall risk safety. Pt performed the below functional mobility tasks with the specified levels of assistance and skilled cuing. Thearpist  educated pt on discussing his return to work and clearance to drive with MD. Pt repeatedly throughout session stating he feels he will be back to his baseline functional level in a few days showing decreased awareness - therapist educates pt on importance of safety at this time to decrease fall risk and injury while healing. Pt participated in Functional Gait Assessment (FGA) with score of 19/30 demonstrating high fall risk (low fall risk 25-28, medium fall risk 19-24, and high fall risk <19). Pt demonstrates minor/moderate LOB primarily when turning or rotating head and utilizes a stepping strategy to maintain upright with min assist. At end of session, pt left seated in recliner with needs in reach and seat belt alarm on.  Mobility Bed Mobility Bed Mobility: Supine to Sit;Sit to Supine Supine to Sit: Supervision/Verbal cueing Sit to Supine: Supervision/Verbal cueing Transfers Transfers: Sit to Stand;Stand to Sit;Stand Pivot Transfers Sit to Stand: Contact Guard/Touching assist Stand to Sit: Contact Guard/Touching assist Stand Pivot Transfers: Contact Guard/Touching assist Transfer (Assistive device): None Locomotion  Gait Ambulation: Yes Gait Assistance: Contact Guard/Touching assist;Minimal Assistance - Patient > 75% Gait Distance (Feet): 1000 Feet Assistive device: None Gait Assistance Details: Verbal cues for precautions/safety;Verbal cues for sequencing;Verbal cues for gait pattern;Tactile cues for posture Gait Gait: Yes Gait Pattern: Impaired Gait Pattern: Step-through pattern (minor lateral postural sway/LOB that is more prominent with turning or head rotations) Gait velocity: decreased High Level Ambulation High Level Ambulation:  (see FGA) Stairs / Additional Locomotion Stairs: Yes Stairs Assistance: Contact Guard/Touching assist Stair Management Technique: Two rails;Alternating pattern;Forwards Number of Stairs: 12 Height of Stairs: 6 Ramp: Contact Guard/touching  assist Curb: Contact Guard/Touching assist Wheelchair Mobility Wheelchair Mobility: No   Discharge Criteria: Patient will be discharged from PT if patient refuses treatment 3 consecutive times without medical reason, if treatment goals not met, if there is a change in medical status, if patient makes no progress towards goals or if patient is discharged from hospital.  The above assessment, treatment plan, treatment alternatives and goals were discussed and mutually agreed upon: by patient  Tawana Scale , PT, DPT, NCS, CSRS 01/03/2022, 7:57 AM

## 2022-01-03 NOTE — Patient Care Conference (Signed)
Inpatient RehabilitationTeam Conference and Plan of Care Update Date: 01/03/2022   Time: 12:00 PM    Patient Name: Randy Gallagher      Medical Record Number: [id number]  Date of Birth: [birth year] Sex: Male         Room/Bed: 5C06C/5C06C-01 Payor Info: Payor: [place] / Plan: BCBS COMM PPO / Product Type: *No Product type* /    Admit Date/Time:  01/02/2022  4:11 PM  Primary Diagnosis:  Brain lesion  Hospital Problems: Principal Problem:   Brain lesion Active Problems:   Brain tumor [facility])    Expected Discharge Date: Expected Discharge Date: 01/07/22  Team Members Present: Physician leading conference: Dr. Alger Simons Social Worker Present: Ovidio Kin, LCSW Nurse Present: Dorien Chihuahua, RN PT Present: Estevan Ryder, PT OT Present: Meriel Pica, OT SLP Present: Weston Anna, SLP PPS Coordinator present : Gunnar Fusi, SLP     Current Status/Progress Goal Weekly Team Focus  Bowel/Bladder     Continent of bowel and bladder; constipation   Continent of bowel and bladder without issues   Constipation addressed  Swallow/Nutrition/ Hydration             ADL's             Mobility   eval in progress         Communication   Supervision A  Mod I  speech intelligibility strategies in conversation, correcy misarticulations and over articulate   Safety/Cognition/ Behavioral Observations  Supervision A  Mod I, Supervision A memory  education, medication/money/time management, higher level attention, short term recall strategies and error awareness   Pain     N/A        Skin     Staples to crani site; cyst on forehead   Incision healing   Assess skin q shift    Discharge Planning:  New evaluation home with sister and brother in-law at times will be alone. Wants to be home by this weekend   Team Discussion: Patient is doing well overall with decreased awareness of deficits and can be easily frustrated. Requesting recommendations for life post discharge  related to return to work and driving, etc.  Patient on target to meet rehab goals: yes, currently needs supervision for speech intelligibility and cues for strategy use. Working on problem solving recall and awareness. Goals for discharge set for supervision overall.  *See Care Plan and progress notes for long and short-term goals.   Revisions to Treatment Plan:  Neuro psych referral   Teaching Needs: Safety, medications, secondary risk management, etc.  Current Barriers to Discharge: N/A  Possible Resolutions to Barriers: Family education     Medical Summary Current Status: right frontal GBM s/p resection. on keppra for seizure prophylaxis. pain seems controlled.  Barriers to Discharge: Medical stability   Possible Resolutions to [employer] Focus: daily assessment of labs, nutrition and patient data   Continued Need for Acute Rehabilitation Level of Care: The patient requires daily medical management by a physician with specialized training in physical medicine and rehabilitation for the following reasons: Direction of a multidisciplinary physical rehabilitation program to maximize functional independence : Yes Medical management of patient stability for increased activity during participation in an intensive rehabilitation regime.: Yes Analysis of laboratory values and/or radiology reports with any subsequent need for medication adjustment and/or medical intervention. : Yes   I attest that I was present, lead the team conference, and concur with the assessment and plan of the team.   Margarito Liner  01/03/2022, 12:46 PM

## 2022-01-04 DIAGNOSIS — Z515 Encounter for palliative care: Secondary | ICD-10-CM

## 2022-01-04 DIAGNOSIS — Z7189 Other specified counseling: Secondary | ICD-10-CM

## 2022-01-04 DIAGNOSIS — D496 Neoplasm of unspecified behavior of brain: Secondary | ICD-10-CM

## 2022-01-04 NOTE — Progress Notes (Signed)
Speech Language Pathology Daily Session Note ? ?Patient Details  ?Name: Randy Gallagher ?MRN: [id number] ?Date of Birth: 1971/08/04 ? ?Today's Date: 01/04/2022 ?SLP Individual Time: 0076-2263 ?SLP Individual Time Calculation (min): 45 min ? ?Short Term Goals: ?Week 1: SLP Short Term Goal 1 (Week 1): STG=LTG due to short ELOS (3/4) ? ?Skilled Therapeutic Interventions: Skilled treatment session focused on cognitive goals. Patient was overall Mod I for functional problem solving with basic money management tasks. Patient independently recalled 3/4 medications and also independently recalled how frequently he receives the medication. Discussed using a pill organizer and/or alarms to assist with recall for taking medications. Patient verbalized understanding but also reported he felt he could recall when to take his medications independently without any reminders.  Patient asking appropriate questions regarding smoking cessation and overall healing from recent surgery. SLP provided education regarding how to live a healthy brain lifestyle and provided a handout to reinforce information. Patient verbalized understanding. Patient left upright in recliner with alarm on and all needs within reach. Continue with current plan of care.  ?   ? ?Pain ?No/Denies Pain  ? ?Therapy/Group: Individual Therapy ? ?Leylani Duley ?01/04/2022, 3:04 PM ?

## 2022-01-04 NOTE — Discharge Summary (Signed)
Physician Discharge Summary  Patient ID: Randy Gallagher MRN: [id number] DOB/AGE: [birth year] 51 y.o.  Admit date: 01/02/2022 Discharge date: 01/05/2022  Discharge Diagnoses:  Principal Problem:   Brain lesion Active Problems:   Brain tumor [facility]) Seizure prophylaxis Tobacco abuse Overweight  Discharged Condition: Stable  Significant Diagnostic Studies: CT Head Wo Contrast  Result Date: 12/26/2021 CLINICAL DATA:  Headaches, nausea, vomiting EXAM: CT HEAD WITHOUT CONTRAST TECHNIQUE: Contiguous axial images were obtained from the base of the skull through the vertex without intravenous contrast. RADIATION DOSE REDUCTION: This exam was performed according to the departmental dose-optimization program which includes automated exposure control, adjustment of the mA and/or kV according to patient size and/or use of iterative reconstruction technique. COMPARISON:  None. FINDINGS: Brain: There is 4.4 x 3.4 cm area of focal inhomogeneous mass in the right frontotemporal region. There is marked surrounding vasogenic edema. There is extrinsic compression of right lateral ventricle. There is a proximally 10 mm shift of midline structures to the left. There is effacement of cortical sulci in the right cerebral hemisphere. There are no epidural or subdural fluid collections. No other discrete focal space-occupying lesions are seen. Vascular: Unremarkable. Skull: No fracture is seen. There is 2.3 cm smooth marginated low-density lesion in the frontal scalp, possibly sebaceous cyst. Sinuses/Orbits: There is mucosal thickening in the ethmoid sinus. Other: None IMPRESSION: There is a large 4.4 cm inhomogeneous mass in the right temporal frontal cortex with surrounding vasogenic edema. There is mass effect with effacement of cortical sulci in the right cerebral hemisphere, extrinsic compression of right lateral ventricle and 10 mm shift of midline structures to the left. Follow-up contrast enhanced MRI and neurosurgical  consultation should be considered. Imaging findings were relayed to Dr. Roderic Palau by telephone call. Electronically Signed   By: Elmer Picker M.D.   On: 12/26/2021 19:58   MR BRAIN W WO CONTRAST  Result Date: 12/29/2021 CLINICAL DATA:  Brain/CNS neoplasm, assess treatment response EXAM: MRI HEAD WITHOUT AND WITH CONTRAST TECHNIQUE: Multiplanar, multiecho pulse sequences of the brain and surrounding structures were obtained without and with intravenous contrast. CONTRAST:  26mL GADAVIST GADOBUTROL 1 MMOL/ML IV SOLN COMPARISON:  Brain MRI from 2 days prior June in FINDINGS: Brain: Status post debulking of right superior temporal mass with expected blood products in place of the necrotic tumor. Peripheral enhancement around the resection cavity, most lobulated and thickest along the anterior superior margin. Similar degree of exuberant vasogenic edema with midline shift measuring 9 mm. No complicating infarct or hydrocephalus. Vascular: Major flow voids and vascular enhancements are preserved. Skull and upper cervical spine: Unremarkable craniotomy flap. Scalp fluid collection beneath the staples measuring up to 1 cm in thickness and reaching the zygoma. Presumed dermal inclusion cyst in the anterior scalp midline. Sinuses/Orbits: Unremarkable IMPRESSION: 1. No complicating feature after debulking of right superior temporal tumor. New baseline study with greatest cavity wall enhancement anteriorly and superiorly. 2. Unchanged vasogenic edema and 9 mm of midline shift. 3. Incisional scalp collection. 4. Motion degraded. Electronically Signed   By: Jorje Guild M.D.   On: 12/29/2021 07:00   MR BRAIN W WO CONTRAST  Result Date: 12/27/2021 CLINICAL DATA:  CNS neoplasm EXAM: MRI HEAD WITHOUT AND WITH CONTRAST TECHNIQUE: Multiplanar, multiecho pulse sequences of the brain and surrounding structures were obtained without and with intravenous contrast. CONTRAST:  11mL GADAVIST GADOBUTROL 1 MMOL/ML IV SOLN  COMPARISON:  Preceding head CT without contrast FINDINGS: Brain: Readily identified superior right temporal lobe mass measuring up to 4.2 cm.  The mass has irregular peripheral enhancement suggesting necrosis . The periphery show signs of dense cellularity by T2 and diffusion imaging. No central purulent appearance of by diffusion imaging. Extensive adjacent T2 hyperintensity with swelling. Midline shift measures up to 1 cm at the level of the third ventricle. No second mass is seen. No acute hemorrhage by CT. No entrapment or infarct. Vascular: Major vessels are enhancing Skull and upper cervical spine: Normal marrow signal Sinuses/Orbits: Negative Other: Motion degraded. 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 T2 hyperintense swelling causes local mass effect and 1 cm of midline shift. Motion degraded. Electronically Signed   By: Jorje Guild M.D.   On: 12/27/2021 05:58   DG Swallowing Func-Speech Pathology  Result Date: 12/30/2021 Table formatting from the original result was not included. Objective Swallowing Evaluation: Type of Study: MBS-Modified Barium Swallow Study  Patient Details Name: Randy Gallagher MRN: [id number] Date of Birth: 16-Jun-1971 Today's Date: 12/30/2021 Time: SLP Start Time (ACUTE ONLY): 1000 -SLP Stop Time (ACUTE ONLY): 1019 SLP Time Calculation (min) (ACUTE ONLY): 19 min Past Medical History: No past medical history on file. Past Surgical History: Past Surgical History: Procedure Laterality Date  CRANIOTOMY Right 12/27/2021  Procedure: CRANIOTOMY TUMOR EXCISION;  Surgeon: Earnie Larsson, MD;  Location: [place];  Service: Neurosurgery;  Laterality: Right;  brain lab HPI: Pt is a 51 y.o. male presented to the ED with 2 weeks of progressive headaches. 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 Pt s/p R frontotemporoparietal craniotomy and resection of tumor on  2/21. No pertinent PMH on file.  No data recorded  Recommendations for follow up therapy are one component of a multi-disciplinary discharge planning process, led by the attending physician.  Recommendations may be updated based on patient status, additional functional criteria and insurance authorization. Assessment / Plan / Recommendation Clinical Impressions 12/30/2021 Clinical Impression Pt preesents with mild oropharyngeal dysphagia characterized by impaired mastication, reduced bolus cohesion, reduced lingual retraction, and an occasional pharyngeal delay. He exhibited intermittent oral residue, vallecular residue, and premature spillage. Penetration (PAS 2) was noted once with thin liquids, but no instances of aspiration were noted despite challenges of large/consecutive boluses. A dysphagia 3 diet with thin liquids is recommended at this time and SLP wil continue to follow pt. SLP Visit Diagnosis Dysphagia, oropharyngeal phase (R13.12) Attention and concentration deficit following -- Frontal lobe and executive function deficit following -- Impact on safety and function Mild aspiration risk   Treatment Recommendations 12/30/2021 Treatment Recommendations Therapy as outlined in treatment plan below   Prognosis 12/30/2021 Prognosis for Safe Diet Advancement Good Barriers to Reach Goals Cognitive deficits Barriers/Prognosis Comment -- Diet Recommendations 12/30/2021 SLP Diet Recommendations Dysphagia 3 (Mech soft) solids;Thin liquid Liquid Administration via Cup;Straw Medication Administration Whole meds with liquid Compensations Small sips/bites;Slow rate Postural Changes Seated upright at 90 degrees   Other Recommendations 12/30/2021 Recommended Consults -- Oral Care Recommendations -- Other Recommendations -- Follow Up Recommendations Acute inpatient rehab (3hours/day) Assistance recommended at discharge Frequent or constant Supervision/Assistance Functional Status Assessment Patient has had a recent decline in  their functional status and demonstrates the ability to make significant improvements in function in a reasonable and predictable amount of time. Frequency and Duration  12/30/2021 Speech Therapy Frequency (ACUTE ONLY) min 2x/week Treatment Duration 1 week   Oral Phase 12/30/2021 Oral Phase Impaired Oral - Pudding Teaspoon -- Oral - Pudding Cup -- Oral -  Honey Teaspoon -- Oral - Honey Cup -- Oral - Nectar Teaspoon -- Oral - Nectar Cup Decreased bolus cohesion Oral - Nectar Straw Decreased bolus cohesion Oral - Thin Teaspoon -- Oral - Thin Cup Decreased bolus cohesion;Premature spillage Oral - Thin Straw Decreased bolus cohesion;Premature spillage Oral - Puree WFL Oral - Mech Soft -- Oral - Regular Impaired mastication;Lingual/palatal residue Oral - Multi-Consistency -- Oral - Pill Reduced posterior propulsion Oral Phase - Comment --  Pharyngeal Phase 12/30/2021 Pharyngeal Phase Impaired Pharyngeal- Pudding Teaspoon -- Pharyngeal -- Pharyngeal- Pudding Cup -- Pharyngeal -- Pharyngeal- Honey Teaspoon -- Pharyngeal -- Pharyngeal- Honey Cup -- Pharyngeal -- Pharyngeal- Nectar Teaspoon -- Pharyngeal -- Pharyngeal- Nectar Cup -- Pharyngeal -- Pharyngeal- Nectar Straw Pharyngeal residue - valleculae;Reduced tongue base retraction Pharyngeal -- Pharyngeal- Thin Teaspoon -- Pharyngeal -- Pharyngeal- Thin Cup Pharyngeal residue - valleculae;Reduced tongue base retraction;Delayed swallow initiation-vallecula;Delayed swallow initiation-pyriform sinuses Pharyngeal -- Pharyngeal- Thin Straw Pharyngeal residue - valleculae;Reduced tongue base retraction;Delayed swallow initiation-pyriform sinuses;Delayed swallow initiation-vallecula Pharyngeal -- Pharyngeal- Puree Pharyngeal residue - valleculae;Reduced tongue base retraction Pharyngeal -- Pharyngeal- Mechanical Soft -- Pharyngeal -- Pharyngeal- Regular Pharyngeal residue - valleculae;Reduced tongue base retraction Pharyngeal -- Pharyngeal- Multi-consistency -- Pharyngeal --  Pharyngeal- Pill Pharyngeal residue - valleculae;Reduced tongue base retraction Pharyngeal -- Pharyngeal Comment --  Cervical Esophageal Phase  12/30/2021 Cervical Esophageal Phase WFL Pudding Teaspoon -- Pudding Cup -- Honey Teaspoon -- Honey Cup -- Nectar Teaspoon -- Nectar Cup -- Nectar Straw -- Thin Teaspoon -- Thin Cup -- Thin Straw -- Puree -- Mechanical Soft -- Regular -- Multi-consistency -- Pill -- Cervical Esophageal Comment -- Shanika I. Hardin Negus, [place], [place] Office number [phone] Pager [phone] Horton Marshall 12/30/2021, 10:59 AM                      Labs:  Basic Metabolic Panel: Recent Labs  Lab 01/03/22 0504  NA 137  K 4.1  CL 103  CO2 26  GLUCOSE 134*  BUN 16  CREATININE 0.81  CALCIUM 9.2    CBC: Recent Labs  Lab 01/03/22 0504  WBC 20.2*  NEUTROABS 15.8*  HGB 16.9  HCT 51.5  MCV 89.3  PLT 391    CBG: No results for input(s): GLUCAP in the last 168 hours.  Family history.  Positive for hypertension as well as hyperlipidemia.  Denies any colon cancer esophageal cancer or rectal cancer  Brief HPI:   Randy Gallagher is a 51 y.o. right-handed male with history of tobacco abuse on no prescription medications.  Lives with his sister independent prior to admission.  Presented 12/26/2021 with progressive headache retro-orbital worse on the right.  Patient had noted over the last couple of weeks developed a subcutaneous collection on his forehead.  Denied any nausea vomiting or seizure.  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  T2 hyperintense swelling local mass effect and a 1 cm midline shift.  Patient underwent right frontal temporal parietal craniotomy resection of tumor utilizing intraoperative stereotaxis and microdissection 12/27/2021 per Dr. Trenton Gammon.  MRI follow-up showed no complicating feature after debulking of right superior temporal tumor.  Unchanged  vasogenic edema and 9 mm of midline shift.  Maintained on Decadron slow taper.  Keppra 500 mg twice daily for seizure prophylaxis.  Therapy evaluations completed due to patient decreased functional mobility was admitted for a comprehensive rehab program.   Hospital Course: Randy Gallagher was admitted to rehab 01/02/2022 for  inpatient therapies to consist of PT, ST and OT at least three hours five days a week. Past admission physiatrist, therapy team and rehab RN have worked together to provide customized collaborative inpatient rehab.  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  Slow Decadron taper.  SCDs for DVT prophylaxis.  Pain managed with use of hydrocodone.  Keppra ongoing for seizure prophylaxis no seizure activity and discontinued after 1 week.  He did have a history of tobacco abuse NicoDerm patch provided counts regards to cessation of nicotine products.  Obesity BMI 25.79 dietary follow-up.   Blood pressures were monitored on TID basis and controlle   Rehab course: During patient's stay in rehab weekly team conferences were held to monitor patient's progress, set goals and discuss barriers to discharge. At admission, patient required min mod assist 250 feet support of wall or railing minimal assist sit to stand  Physical exam.  Blood pressure 116/71 pulse 84 temperature 98 to respirations 18 oxygen saturation 97% room air Constitutional.  No acute distress HEENT Head.  Craniotomy site clean and dry subcutaneous fluid collection consistent with subcutaneous cyst mid frontal region Eyes.  Pupils round and reactive to light no discharge.nystagmus Neck.  Supple nontender no JVD without thyromegaly Cardiac regular rate rhythm any extra sounds or murmur heard Abdomen.  Soft nontender positive bowel sounds without rebound Neurologic.  Alert makes eye contact with examiner mood was a bit  flat but appropriate.  Patient moves all 4 extremities   He/She  has had improvement in activity tolerance, balance, postural control as well as ability to compensate for deficits. He/She has had improvement in functional use RUE/LUE  and RLE/LLE as well as improvement in awareness.  Currently requires minimal assist for mobility.  He can gather his belongings for activities of daily living and homemaking.  Contact-guard assist for basic self-care skills.  He did display some mild cognitive linguistic deficits in the areas of attention and short-term recall which was discussed with family and the need for assistance on discharge for safety.  Full family teaching completed plan discharged to home       Disposition: Discharge to home    Diet: Regular  Special Instructions: No driving smoking or alcohol  Medications at discharge 1.  Tylenol as needed 2.  Decadron 2 mg every 12 hours x3 days then 2 mg daily x3 days and stop 3.  NicoDerm patch taper as directed  30-35 minutes were spent completing discharge summary and discharge planning  Discharge Instructions     Ambulatory referral to Physical Medicine Rehab   Complete by: As directed    Moderate complexity follow-up 1 to 2 weeks brain tumor suspect glioma        Follow-up Information     Raulkar, Clide Deutscher, MD Follow up.   Specialty: Physical Medicine and Rehabilitation Why: Office to call for appointment Contact information: [address] [place] [address] [phone]         Earnie Larsson, MD Follow up.   Specialty: Neurosurgery Why: Call for appointment Contact information: [address] [place] [address] [phone]                 Signed: Cathlyn Parsons 01/05/2022, 5:35 AM

## 2022-01-04 NOTE — Progress Notes (Signed)
?                                                       PROGRESS NOTE ? ? ?Subjective/Complaints: ?No new complaints today ?Still hopes to leave by Friday- messaged therapy team and they are in agreement. ?Staples may be removed tomorrow. ? ?ROS: denies pain ? ? ?Objective: ?  ?No results found. ?Recent Labs  ?  01/03/22 ?1914  ?WBC 20.2*  ?HGB 16.9  ?HCT 51.5  ?PLT 391  ? ?Recent Labs  ?  01/03/22 ?7829  ?NA 137  ?K 4.1  ?CL 103  ?CO2 26  ?GLUCOSE 134*  ?BUN 16  ?CREATININE 0.81  ?CALCIUM 9.2  ? ? ?Intake/Output Summary (Last 24 hours) at 01/04/2022 1835 ?Last data filed at 01/04/2022 0700 ?Gross per 24 hour  ?Intake 120 ml  ?Output 725 ml  ?Net -605 ml  ?  ? ?  ? ?Physical Exam: ?Vital Signs ?Blood pressure (!) 122/93, pulse 83, temperature (!) 97.5 ?F (36.4 ?C), resp. rate 17, height 5' 10.5" (1.791 m), weight 82.7 kg, SpO2 96 %. ?Gen: no distress, normal appearing ?HENT:  ?   Head:  ?   Comments: Craniotomy site clean and dry.  Subcutaneous fluid collection consistent with subcutaneous cyst mid frontal region ?Cardio: Reg rate ?Chest: normal effort, normal rate of breathing ?Abd: soft, non-distended ?Ext: no edema ?Psych: pleasant, normal affect ?Skin: intact ?Neurological:  ?   Comments: Patient is alert.  Can recall 3/4 of his medications independently. Makes eye contact with examiner.  Mood is a bit flat.  Provides name age and date of birth with some mild delay in processing. Cranial nerves II through XII intact, motor strength is 5/5 in bilateral deltoid, bicep, tricep, grip, hip flexor, knee extensors, ankle dorsiflexor and plantar flexor ?Sensory exam normal sensation to light touch and proprioception in bilateral upper and lower extremities ?  ?Musculoskeletal: Full range of motion in all 4 extremities. No joint swelling ? ? ?Assessment/Plan: ?1. Functional deficits which require 3+ hours per day of interdisciplinary therapy in a comprehensive inpatient rehab setting. ?Physiatrist is providing close team  supervision and 24 hour management of active medical problems listed below. ?Physiatrist and rehab team continue to assess barriers to discharge/monitor patient progress toward functional and medical goals ? ?Care Tool: ? ?Bathing ?   ?Body parts bathed by patient: Right arm, Left arm, Chest, Abdomen, Front perineal area, Buttocks, Right upper leg, Left upper leg, Face, Left lower leg, Right lower leg (simulated reaching LB with CGA)  ?   ?  ?  ?Bathing assist Assist Level: Contact Guard/Touching assist ?  ?  ?Upper Body Dressing/Undressing ?Upper body dressing   ?What is the patient wearing?: Pull over shirt ?   ?Upper body assist Assist Level: Set up assist ?   ?Lower Body Dressing/Undressing ?Lower body dressing ? ? ?   ?What is the patient wearing?: Pants ? ?  ? ?Lower body assist Assist for lower body dressing: Contact Guard/Touching assist ?   ? ?Toileting ?Toileting    ?Toileting assist Assist for toileting: Supervision/Verbal cueing (simulated) ?  ?  ?Transfers ?Chair/bed transfer ? ?Transfers assist ?   ? ?Chair/bed transfer assist level: Contact Guard/Touching assist ?  ?  ?Locomotion ?Ambulation ? ? ?Ambulation assist ? ?   ? ?Assist level: Minimal  Assistance - Patient > 75% ?Assistive device: No Device ?Max distance: 1,08ft  ? ?Walk 10 feet activity ? ? ?Assist ?   ? ?Assist level: Minimal Assistance - Patient > 75% ?Assistive device: No Device  ? ?Walk 50 feet activity ? ? ?Assist   ? ?Assist level: Minimal Assistance - Patient > 75% ?Assistive device: No Device  ? ? ?Walk 150 feet activity ? ? ?Assist   ? ?Assist level: Minimal Assistance - Patient > 75% ?Assistive device: No Device ?  ? ?Walk 10 feet on uneven surface  ?activity ? ? ?Assist   ? ? ?Assist level: Minimal Assistance - Patient > 75% ?   ? ?Wheelchair ? ? ? ? ?Assist Is the patient using a wheelchair?: No ?  ?  ? ?  ?   ? ? ?Wheelchair 50 feet with 2 turns activity ? ? ? ?Assist ? ?  ?  ? ? ?   ? ?Wheelchair 150 feet activity   ? ? ? ?Assist ?   ? ? ?   ? ?Blood pressure (!) 122/93, pulse 83, temperature (!) 97.5 ?F (36.4 ?C), resp. rate 17, height 5' 10.5" (1.791 m), weight 82.7 kg, SpO2 96 %. ? ?Medical Problem List and Plan: ?1. 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  Status post right frontal temporoparietal craniotomy resection of tumor 12/27/2021.  Follow-up per neurosurgery.  Slow taper of Decadron. ?            -patient may  shower with shower cap ?            -ELOS/Goals: 4 days ? Messaged team to let them know he would like to leave by Friday.  ? Staples may be removed tomorrow ?2.  Antithrombotics: ?-DVT/anticoagulation:  Mechanical: Antiembolism stockings, thigh (TED hose) Bilateral lower extremities.   ?            -antiplatelet therapy: N/A ?3. Pain: discontinue hydrocodone. ?4. Mood: Provide emotional support ?            -antipsychotic agents: N/A ?5. Neuropsych: This patient is capable of making decisions on his own behalf. ?6. Skin/Wound Care: Routine skin checks ?7. Fluids/Electrolytes/Nutrition: Routine in and outs with follow-up chemistries ?8.  Seizure prophylaxis: >1 week out without seizures, d/c Keprra ?9.  Tobacco abuse.  NicoDerm patch.  Provide counseling ?10. Hyperglycemia: advised avoiding added sugar in diet ?11. Hypoalbuminemia: advised high protein diet to promote wound healing.  ?12. Overweight: BMI 25.79: provide dietary education ?13. Constipation: continue colace BID ?  ? ?LOS: ?2 days ?A FACE TO FACE EVALUATION WAS PERFORMED ? ?Martha Clan P Rainier Feuerborn ?01/04/2022, 6:35 PM  ? ?  ?

## 2022-01-04 NOTE — Progress Notes (Signed)
Patient ID: Randy Gallagher, male   DOB: 1971/04/01, 51 y.o.   MRN: [id number] Pt agreeable to OP at [facility] have faxed over the order. Will call him or his siter to set up appointments ?

## 2022-01-04 NOTE — Progress Notes (Signed)
Physical Therapy Session Note ? ?Patient Details  ?Name: Randy Gallagher ?MRN: [id number] ?Date of Birth: Mar 31, 1971 ? ?Today's Date: 01/04/2022 ?PT Individual Time: 3235-5732 ?PT Individual Time Calculation (min): 25 min  ? ?Short Term Goals: ?Week 1:  PT Short Term Goal 1 (Week 1): = to LTGs based on ELOS ? ?Skilled Therapeutic Interventions/Progress Updates:  ?  Patient received sitting up in recliner, agreeable to PT. He denies pain. Patient ambulating to 5th floor therapy gym with no AD and close supervision. General instability noted in his gait with slight deviation from straight path. Patient hyperverbal with very poor insight into his deficits and prognosis. Patient repeatedly stating that he would "be better tomorrow" and is able to go home tomorrow. PT informing patient of set dc date. Patient completing 10 min on Nustep for improved coordination and reciprocal movement of UE and LE. He ambulated back to his room with no cues needed for path finding. Remaining up in wc, seatbelt alarm on, call light within reach.  ? ?Therapy Documentation ?Precautions:  ?Precautions ?Precautions: Fall ?Precaution Comments: decreased safety awareness, R crani ?Restrictions ?Weight Bearing Restrictions: No ? ? ?Therapy/Group: Individual Therapy ? ?Debbora Dus ?Debbora Dus, PT, DPT, CBIS ? ?01/04/2022, 7:30 AM  ?

## 2022-01-04 NOTE — Consult Note (Signed)
? ?                                                                                ?Consultation Note ?Date: 01/04/2022  ? ?Patient Name: Randy Gallagher  ?DOB: 05-18-1971  MRN: [id number]  Age / Sex: 51 y.o., male  ?PCP: Pcp, No ?Referring Physician: Izora Ribas, MD ? ?Reason for Consultation: Establishing goals of care ? ?HPI/Patient Profile: 51 y.o. male  admitted on initially on 12/26/2021 with progressive headaches.  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  Patient underwent right frontal temporal parietal craniotomy resection of tumor on 12/27/2021 with Dr. Trenton Gammon.  MRI follow-up showed no complicating features after debulking. ? ?Biopsy pending. ? ?Patient currently in [facility] for continued rehabilitation. ? ?Patient and family face ongoing treatment option decisions, advanced directive decisions and anticipatory care needs. ? ? ?Clinical Assessment and Goals of Care: ? ?This NP Wadie Lessen reviewed medical records, received report from team, assessed the patient and then meet at the patient's bedside  to discuss diagnosis, prognosis, GOC, EOL wishes disposition and options. ?  ?Concept of Palliative Care was introduced as specialized medical care for people and their families living with serious illness.  If focuses on providing relief from the symptoms and stress of a serious illness.  The goal is to improve quality of life for both the patient and the family.  Values and goals of care important to patient and family were attempted to be elicited. ? ?Created space and opportunity for patient  and family to explore thoughts and feelings regarding current medical situation.  I worry that patient has limited insight into his current medical situation.  He really was not interested in details, he "just wants to go home" ?  ?Education offered on the importance of advance directives and securing it healthcare power of attorney.  Again he did not  wish to talk about "that right now", and declined assistance from spiritual care with securing those documents. ? ?Questions and concerns addressed.  Patient was very pleasant and liked small talk, I encouraged to call with questions or concerns.   ?  ?Recommended outpatient community-based palliative care services at home. ?  ?With patient's permission I attempted to reach sister/main contact by telephone, was unable to leave message secondary to mailbox being full. ?  ?  ? ?There are no documented advance care planning documents or healthcare power of attorney.  However patient tells me that in the event that he could not speak for himself ?  ? ?  ? ?SUMMARY OF RECOMMENDATIONS   ? ?Code Status/Advance Care Planning: ?Full code ? ? ?Symptom Management:  ?Headache: Tylenol 650 mg p.o. every 6 hours as needed ? ?Palliative Prophylaxis:  ?Bowel Regimen, Delirium Protocol, and Frequent Pain Assessment ? ?Additional Recommendations (Limitations, Scope, Preferences): ?Patient is open to all offered and available medical interventions to prolong life. ? ?Psycho-social/Spiritual:  ?Desire for further Chaplaincy support:no ? ? ?Prognosis:  ?Unable to determine ? ?Discharge Planning: Home with Home Health  ? ?  ? ?Primary Diagnoses: ?Present on Admission: ? Brain lesion ? Brain tumor ([place]) ? ? ?I have  reviewed the medical record, interviewed the patient and family, and examined the patient. The following aspects are pertinent. ? ?History reviewed. No pertinent past medical history. ?Social History  ? ?Socioeconomic History  ? Marital status: Divorced  ?  Spouse name: Not on file  ? Number of children: Not on file  ? Years of education: Not on file  ? Highest education level: Not on file  ?Occupational History  ? Not on file  ?Tobacco Use  ? Smoking status: Every Day  ?  Packs/day: 1.00  ?  Types: Cigarettes  ? Smokeless tobacco: Never  ?Vaping Use  ? Vaping Use: Never used  ?Substance and Sexual Activity  ? Alcohol use:  Never  ? Drug use: Not Currently  ? Sexual activity: Not on file  ?Other Topics Concern  ? Not on file  ?Social History Narrative  ? Not on file  ? ?Social Determinants of Health  ? ?Financial Resource Strain: Not on file  ?Food Insecurity: Not on file  ?Transportation Needs: Not on file  ?Physical Activity: Not on file  ?Stress: Not on file  ?Social Connections: Not on file  ? ?History reviewed. No pertinent family history. ?Scheduled Meds: ? dexamethasone  2 mg Oral Q12H  ? docusate sodium  100 mg Oral BID  ? levETIRAcetam  500 mg Oral BID  ? nicotine  21 mg Transdermal Daily  ? pantoprazole  40 mg Oral QHS  ? ?Continuous Infusions: ?PRN Meds:.acetaminophen **OR** acetaminophen, bisacodyl, HYDROcodone-acetaminophen, ondansetron **OR** ondansetron (ZOFRAN) IV, polyethylene glycol ?Medications Prior to Admission:  ?Prior to Admission medications   ?Medication Sig Start Date End Date Taking? Authorizing Provider  ?dexamethasone (DECADRON) 2 MG tablet Take 1 tablet (2 mg total) by mouth every 12 (twelve) hours. 01/02/22   Viona Gilmore D, NP  ?docusate sodium (COLACE) 100 MG capsule Take 1 capsule (100 mg total) by mouth 2 (two) times daily. 01/02/22   Viona Gilmore D, NP  ?HYDROcodone-acetaminophen (NORCO/VICODIN) 5-325 MG tablet Take 1-2 tablets by mouth every 4 (four) hours as needed for moderate pain. 01/02/22   Viona Gilmore D, NP  ?levETIRAcetam (KEPPRA) 500 MG tablet Take 1 tablet (500 mg total) by mouth 2 (two) times daily. 01/02/22   Viona Gilmore D, NP  ?methocarbamol 500 mg in dextrose 5 % 50 mL Inject 500 mg into the vein every 6 (six) hours as needed. 01/02/22   Viona Gilmore D, NP  ?nicotine (NICODERM CQ - DOSED IN MG/24 HOURS) 21 mg/24hr patch Place 1 patch (21 mg total) onto the skin daily. 01/03/22   Patricia Nettle, NP  ? ?No Known Allergies ?Review of Systems  ?Constitutional:   ?     C/o discomfort if he sleep on incision line   ? ?Physical Exam ?HENT:  ?   Head:  ?   Comments: Incision line  is clean dry and closed ?Cardiovascular:  ?   Rate and Rhythm: Normal rate.  ?Pulmonary:  ?   Effort: Pulmonary effort is normal.  ?Skin: ?   General: Skin is warm and dry.  ?Neurological:  ?   Mental Status: He is alert.  ? ? ?Vital Signs: BP (!) 122/93 (BP Location: Right Arm)   Pulse 83   Temp (!) 97.5 ?F (36.4 ?C)   Resp 17   Ht 5' 10.5" (1.791 m)   Wt 82.7 kg   SpO2 96%   BMI 25.79 kg/m?  ?Pain Scale: 0-10 ?  ?Pain Score: 0-No pain ? ? ?SpO2: SpO2: 96 % ?  O2 Device:SpO2: 96 % ?O2 Flow Rate: .  ? ?IO: Intake/output summary:  ?Intake/Output Summary (Last 24 hours) at 01/04/2022 1646 ?Last data filed at 01/04/2022 0700 ?Gross per 24 hour  ?Intake 360 ml  ?Output 725 ml  ?Net -365 ml  ? ? ?LBM: Last BM Date : 01/02/22 ?Baseline Weight: Weight: 82.7 kg ?Most recent weight: Weight: 82.7 kg     ?Palliative Assessment/Data: ? ? ?  Discussed with Becky/licensed clinical social worker ? ?Time In: 1100 ?Time Out: 1200 ?Time Total: 60 minutes ?Greater than 50%  of this time was spent counseling and coordinating care related to the above assessment and plan. ? ?Signed by: ?Wadie Lessen, NP ?  ?Please contact Palliative Medicine Team phone at [phone] for questions and concerns.  ?For individual provider: See Amion ? ? ? ? ? ? ? ? ? ? ? ? ? ?

## 2022-01-04 NOTE — Progress Notes (Signed)
Occupational Therapy Session Note ? ?Patient Details  ?Name: Randy Gallagher ?MRN: [id number] ?Date of Birth: [birth year] ? ?Today's Date: 01/04/2022 ?OT Individual Time: [phone] ?OT Individual Time Calculation (min): 60 min  ? ? ?Short Term Goals: ?Week 1:  OT Short Term Goal 1 (Week 1): STG = LTG 2/2 LOS ? ?Skilled Therapeutic Interventions/Progress Updates:  ?   ?Pt received in bed with no pain  ?ADL: ?Pt completes ADL at overall supervision/set up Level. Skilled interventions include: VC for seated doffing and donning of pants ? ? ?Therapeutic activity ?Pt completes kitchen safety assessment searching/remedying for 5 unsafe situations in kitchen may encounter (knife on edge of counter, cup on floor, water running in sink, cabinet door open, and burner left on).  ? ?Pt able to identify 5 /5 situations with min cuing. Activity completed for safety awareness and kitchen navigation. Pt education on reason each scenario is unsafe/risks. Pt kicks cup on floor before noticing and picking it up ? ? ?Ambulation with no AD to/from all tx destiantions with VC for visual scanning as pt walks by room at end of session and ADL apartemnt when told to look for this familiar room ? ?Pt demo decreased speed of walking especially with dual task occasionally stopping to think of animals in alphabetical order and stops bouncing ball when carrying conversation with therapist while walking forwards/backwards ? ?Pt completes circuit with focus of recall of exercises in order with MOD cuing and focus of standing balance and SL stance/righting reactions: lateral stepping, moster walks, SL stance to cone target with pt able to maintain SL stance 20% of time ? ?Twister in trail making B pattern at amb level no AD to work on dual task, tight turning and stepping in all directions. Pt requires min cuing to follow alternating number/letter patter with 3 errors and required 5 min 35 secs to complete ? ?Pt left at end of session in bed with exit  alarm on, call light in reach and all needs met ? ? ?Therapy Documentation ?Precautions:  ?Precautions ?Precautions: Fall ?Precaution Comments: decreased safety awareness, R crani ?Restrictions ?Weight Bearing Restrictions: No ?General: ?  ? ?Therapy/Group: Individual Therapy ? ?Lowella Dell Jalynne Persico ?01/04/2022, 6:50 AM ?

## 2022-01-04 NOTE — Progress Notes (Signed)
Occupational Therapy Session Note ? ?Patient Details  ?Name: Randy Gallagher ?MRN: [id number] ?Date of Birth: January 24, 1971 ? ?Today's Date: 01/04/2022 ?OT Individual Time:  -  ?   ? ? ?Short Term Goals: ?Week 1:  OT Short Term Goal 1 (Week 1): STG = LTG 2/2 LOS ?Week 2:    ? ?Skilled Therapeutic Interventions/Progress Updates:  ?  Pt seated in his recliner with no c/o pain upon arrival.  Pt able to complete sit to stand for a duration  completing static and dynamic standing for  5-7 minute intervals while  completing table top activity cognitive task.to address memory deficits.  The pt was also instructed in simulate task in UB dressing to avoid surgical site with moderate verbal cues and 1 demonstration.  The pt also completed UB exercise to improve his UB strength for safe and independent task completion.  The pt had no c/o pain and was returned to his recliner with his bedside table and call light nearby, and his alarm in place all addition needs were addressed.  ? ?Therapy Documentation ?Precautions:  ?Precautions ?Precautions: Fall ?Precaution Comments: decreased safety awareness, R crani ?Restrictions ?Weight Bearing Restrictions: No ? ? ?Therapy/Group: Individual Therapy ? ?Yvonne Kendall ?01/04/2022, 10:46 AM ?

## 2022-01-04 NOTE — Progress Notes (Signed)
Occupational Therapy Session Note ? ?Patient Details  ?Name: Randy Gallagher ?MRN: [id number] ?Date of Birth: 09-02-1971 ? ?Today's Date: 01/04/2022 ?OT Individual Time: [id number] ?OT Individual Time Calculation (min): 25 min  ? ? ?Short Term Goals: ?Week 1:  OT Short Term Goal 1 (Week 1): STG = LTG 2/2 LOS ? ?Skilled Therapeutic Interventions/Progress Updates:  ?Skilled OT intervention completed with focus on d/c planning, DME and home/self-care management. Pt received seated in recliner. Pt verbally expressing the desire to d/c soon, with this OT discussing with pt and educating on DME potentially used to increase safety at home. Pt reporting he has tub/shower, and his mother had tub bench when she lived with him that he has access to, with education provided and demonstrated to pt on shower transfer technique. Education provided to pt on using shower cap to protect staples, with therapist providing pt with one, as well as discussing strategies of using hand held shower head to avoid his head vs body until staples removed.  ? ?Pt completed ambulatory shower transfer without AD to really low toilet in room with supervision, including standing from low surface without use of grab bar. Discussed pt's toilet situation, with grab bars available around pt's toilet at home, and therefore no BSC recommended. This therapist updated CSW and care team about pt's status and no DME needs. Pt was left seated in recliner, with belt alarm on and all needs in reach at end of session. ? ? ?Therapy Documentation ?Precautions:  ?Precautions ?Precautions: Fall ?Precaution Comments: decreased safety awareness, R crani ?Restrictions ?Weight Bearing Restrictions: No ? ?Pain: ?No c/o pain ? ? ?Therapy/Group: Individual Therapy ? ?[name] ?01/04/2022, 7:49 AM ?

## 2022-01-05 ENCOUNTER — Other Ambulatory Visit (HOSPITAL_COMMUNITY): Payer: Self-pay

## 2022-01-05 MED ORDER — DEXAMETHASONE 2 MG PO TABS
ORAL_TABLET | ORAL | 0 refills | Status: DC
  Filled 2022-01-05: qty 9, 6d supply, fill #0

## 2022-01-05 MED ORDER — NICOTINE 21 MG/24HR TD PT24
MEDICATED_PATCH | TRANSDERMAL | 0 refills | Status: DC
  Filled 2022-01-05: qty 28, fill #0

## 2022-01-05 MED ORDER — ACETAMINOPHEN 325 MG PO TABS: 650.0000 mg | ORAL_TABLET | Freq: Four times a day (QID) | ORAL | Status: DC | PRN

## 2022-01-05 NOTE — Plan of Care (Signed)
?  Problem: RH Stairs ?Goal: LTG Patient will ambulate up and down stairs w/assist (PT) ?Description: LTG: Patient will ambulate up and down # of stairs with assistance (PT) ?Outcome: Not Met (add Reason) ?  ?Problem: RH Balance ?Goal: LTG Patient will maintain dynamic sitting balance (PT) ?Description: LTG:  Patient will maintain dynamic sitting balance with assistance during mobility activities (PT) ?Outcome: Completed/Met ?Goal: LTG Patient will maintain dynamic standing balance (PT) ?Description: LTG:  Patient will maintain dynamic standing balance with assistance during mobility activities (PT) ?Outcome: Completed/Met ?  ?Problem: Sit to Stand ?Goal: LTG:  Patient will perform sit to stand with assistance level (PT) ?Description: LTG:  Patient will perform sit to stand with assistance level (PT) ?Outcome: Completed/Met ?  ?Problem: RH Bed Mobility ?Goal: LTG Patient will perform bed mobility with assist (PT) ?Description: LTG: Patient will perform bed mobility with assistance, with/without cues (PT). ?Outcome: Completed/Met ?  ?Problem: RH Bed to Chair Transfers ?Goal: LTG Patient will perform bed/chair transfers w/assist (PT) ?Description: LTG: Patient will perform bed to chair transfers with assistance (PT). ?Outcome: Completed/Met ?  ?Problem: RH Car Transfers ?Goal: LTG Patient will perform car transfers with assist (PT) ?Description: LTG: Patient will perform car transfers with assistance (PT). ?Outcome: Completed/Met ?  ?Problem: RH Ambulation ?Goal: LTG Patient will ambulate in controlled environment (PT) ?Description: LTG: Patient will ambulate in a controlled environment, # of feet with assistance (PT). ?Outcome: Completed/Met ?Goal: LTG Patient will ambulate in home environment (PT) ?Description: LTG: Patient will ambulate in home environment, # of feet with assistance (PT). ?Outcome: Completed/Met ?  ?

## 2022-01-05 NOTE — Progress Notes (Signed)
Speech Language Pathology Discharge Summary ? ?Patient Details  ?Name: Randy Gallagher ?MRN: [id number] ?Date of Birth: 10/12/1971 ? ? ?Patient has met 3 of 5 long term goals.  Patient to discharge at overall Supervision level. ? ?Reasons goals not met: Patient requires overall supervision level verbal cues for emergent awareness and complex problem solving and was discharged home earlier than anticipated.  ? ?Clinical Impression/Discharge Summary: Patient has made functional gains and has met 3 of 5 LTGs this admission. Currently, patient requires overall supervision level verbal cues to complete functional and complex tasks safely in regards to problem solving, recall, attention and awareness. Patient is also overall Mod I for verbal expression at the sentence level. Patient education is complete and patient will discharge home with 24 hour supervision from family. Patient would benefit from f/u SLP services to maximize his cognitive functioning and overall functional independence in order to reduce caregiver burden.  ? ?Care Partner:  ?Caregiver Able to Provide Assistance: Yes  ?Type of Caregiver Assistance: Cognitive ? ?Recommendation:  ?24 hour supervision/assistance;Outpatient SLP  ?Rationale for SLP Follow Up: Reduce caregiver burden;Maximize cognitive function and independence  ? ?Equipment: N/A  ? ?Reasons for discharge: Discharged from hospital  ? ?Patient/Family Agrees with Progress Made and Goals Achieved: Yes  ? ? ?[name] ?01/05/2022, 4:04 PM ? ?

## 2022-01-05 NOTE — Progress Notes (Signed)
Patient ID: Randy Gallagher, male   DOB: 12-Nov-1970, 51 y.o.   MRN: [id number] Met with pt, sister and brother in-law who is in his room. Pt is asking to discharge today. MD is agreeable to this. Team feels reached goals. Sister reports he will have 24/7 supervision at discharge. Aware of OP via [facility] will call to set up follow up. Aware needs to get PCP sister to work on once home. ?

## 2022-01-05 NOTE — Progress Notes (Signed)
Inpatient Rehabilitation Care Coordinator ?Discharge Note  ? ?Patient Details  ?Name: Randy Gallagher ?MRN: [id number] ?Date of Birth: [birth year] ? ? ?Discharge location: [facility] [place] CAN PROVIDE SUPERVISION LEVEL ? ?Length of Stay: 3 DAYS  ? ?Discharge activity level: SUPERVISION LEVEL ? ?Home/community participation: ACTIVE ? ?Patient response NW:GNFAOZ Literacy - How often do you need to have someone help you when you read instructions, pamphlets, or other written material from your doctor or pharmacy?: Never ? ?Patient response HY:QMVHQI Isolation - How often do you feel lonely or isolated from those around you?: Never ? ?Services provided included: MD, RD, PT, OT, SLP, RN, CM, Pharmacy, SW ? ?Financial Services:  ?Charity fundraiser Utilized: Private Insurance ?BCBS ? ?Choices offered to/list presented to: PT AND SISTER ? ?Follow-up services arranged:  ?Outpatient, Patient/Family has no preference for HH/DME agencies ?   ?Outpatient Servicies: [facility]-PT, OT, SP WILL CALL SISTER TO MAKE FOLLOW UP APPOINTMENTS ? HAS ALL EQUIPMENT FROM GRANDMOTHER ?  ? ?Patient response to transportation need: ?Is the patient able to respond to transportation needs?: Yes ?In the past 12 months, has lack of transportation kept you from medical appointments or from getting medications?: No ?In the past 12 months, has lack of transportation kept you from meetings, work, or from getting things needed for daily living?: No ? ? ? ?Comments (or additional information):SISTER AND BROTHER [place] HERE AND ARE PREPARED TO PROVIDE 24/7 SUPERVISION. WORK ON PCP FOR PT AND READY TO TAKE HOME ? ?Patient/Family verbalized understanding of follow-up arrangements:  Yes ? ?Individual responsible for coordination of the follow-up plan: Randy Gallagher-SISTER [phone] ? ?Confirmed correct DME delivered: Randy Gallagher 01/05/2022   ? ?Randy Gallagher ?

## 2022-01-05 NOTE — Progress Notes (Signed)
Occupational Therapy Discharge Summary ? ?Patient Details  ?Name: Randy Gallagher ?MRN: [id number] ?Date of Birth: 04/24/1971 ? ?Pt discharged from hospital before his only OT session today.  Information on discharge summary provided by PTs that worked with pt today and OTs that worked with pt yesterday. ? ?Pt education on safety precautions. ? ? ?Patient has met 9 of 10 long term goals due to improved activity tolerance, improved balance, and ability to compensate for deficits.  Patient to discharge at overall Supervision level.  Patient's care partner is independent to provide the necessary cognitive assistance at discharge.   ? ?Reasons goals not met: pt need supervision with meal prep, had a mod I goal ? ?Recommendation:  ?Patient will benefit from ongoing skilled OT services in outpatient setting to continue to advance functional skills in the area of BADL and iADL. ? ?Equipment: ?No equipment provided - pt has a tub bench ? ?Reasons for discharge: treatment goals met ? ?Patient/family agrees with progress made and goals achieved: Yes ? ?OT Discharge ?Precautions/Restrictions  ?Precautions ?Precautions: Fall ?Precaution Comments: decreased safety awareness, R crani ?ADL ?ADL ?Eating: Independent ?Grooming: Independent ?Where Assessed-Grooming: Edge of bed ?Upper Body Bathing: Setup ?Where Assessed-Upper Body Bathing: Shower ?Lower Body Bathing: Setup ?Where Assessed-Lower Body Bathing: Shower ?Upper Body Dressing: Setup ?Lower Body Dressing: Setup ?Where Assessed-Lower Body Dressing: Edge of bed ?Toileting: Modified independent ?Toilet Transfer: Modified independent ?Vision ?Baseline Vision/History: 0 No visual deficits ?Patient Visual Report: No change from baseline ?Vision Assessment?: No apparent visual deficits ?Perception  ?Perception: Within Functional Limits ?Praxis ?Praxis: Intact ?Cognition ?Year:  (pt left hospital before OT session, before questions could be asked) ?Month:  (pt left hospital before OT  session, before questions could be asked) ?Day of Week:  (pt left hospital before OT session, before questions could be asked) ?Immediate Memory Recall:  (pt left hospital before OT session, before questions could be asked) ?Memory Recall Sock:  (pt left hospital before OT session, before questions could be asked) ?Memory Recall Blue:  (pt left hospital before OT session, before questions could be asked) ?Memory Recall Bed:  (pt left hospital before OT session, before questions could be asked) ?Sensation ?Sensation ?Light Touch: Appears Intact ?Hot/Cold: Appears Intact ?Proprioception: Appears Intact ?Stereognosis: Appears Intact ?Coordination ?Gross Motor Movements are Fluid and Coordinated: No ?Coordination and Movement Description: grossly uncoordinated due to lateral postural sway during mobility ?Finger Nose Finger Test: WFL ?Motor  ?Motor ?Motor: Other (comment) ?Motor - Skilled Clinical Observations: impaired due to postural sway with mobility and impaired balance ?Motor - Discharge Observations: Postural sway with general instability ?Mobility  ?Bed Mobility ?Bed Mobility: Supine to Sit;Sit to Supine;Rolling Right;Rolling Left ?Rolling Right: Independent ?Rolling Left: Independent ?Supine to Sit: Independent ?Sit to Supine: Independent ?Transfers ?Sit to Stand: Supervision/Verbal cueing ?Stand to Sit: Supervision/Verbal cueing  ?Trunk/Postural Assessment  ?Cervical Assessment ?Cervical Assessment: Exceptions to WFL ?Thoracic Assessment ?Thoracic Assessment: Exceptions to WFL ?Lumbar Assessment ?Lumbar Assessment: Within Functional Limits ?Postural Control ?Postural Control: Deficits on evaluation ?Protective Responses: delayed but sufficient for minor LOB  ?Balance ?Balance ?Balance Assessed: Yes ?Standardized Balance Assessment ?Standardized Balance Assessment: Functional Gait Assessment ?Extremity/Trunk Assessment ?RUE Assessment ?RUE Assessment: Within Functional Limits ?LUE Assessment ?Active Range of  Motion (AROM) Comments: WNL ?General Strength Comments: WFL but slightly weaker compared to R, pt reporting this is dominant side. Grossly 4/5 ? ? ?SAGUIER,JULIA ?01/05/2022, 12:39 PM ?

## 2022-01-05 NOTE — Progress Notes (Signed)
Physical Therapy Session Note ? ?Patient Details  ?Name: Randy Gallagher ?MRN: [id number] ?Date of Birth: [birth year] ? ?Today's Date: 01/05/2022 ?PT Individual Time: 0932-6712 ?PT Individual Time Calculation (min): 30 min  ?Today's Date: 01/05/2022 ?PT Missed Time: 45 Minutes ?Missed Time Reason: Other (Comment) (waiting for MD for D/C) ? ?Short Term Goals: ?Week 1:  PT Short Term Goal 1 (Week 1): = to LTGs based on ELOS ? ?Skilled Therapeutic Interventions/Progress Updates:  ? Received pt sitting in recliner waiting on MD and prepared to leave today. Pt agreeable to PT treatment while waiting and denied any pain during session. Consulted with RN who confirmed that Dr. Ranell Patrick is in agreement with pt discharging today - notified primary PT. Session with emphasis on discharge planning, functional mobility/transfers, generalized strengthening and endurance, dynamic standing balance/coordination, gait training, and stair navigation. Pt performed all transfers without AD and mod I (with increased time) and ambulated >583ft without AD and mod I to/from [facility] with no LOB noted but 1 instance of L inattention bumping into chair. In stairwell, pt navigated 2 flights of steps (11+12) with 1 handrail and supervision ascending and descending with a step through pattern. Discussed pt's current deficits (with pt demonstrating decreased insight into deficits and poor safety awareness). On multiple occasions, pt looking out window stating "I think that's my sister" but when therapist asked who was picking up pt, he stated "I don't know, I guess I need to call my sister" but later told therapist that his sister was on the way. Returned to room to see if Dr. Ranell Patrick was there and took seated rest break in recliner. Pt politely declined any further participation in therapy stating he is "ready to go". Concluded session with pt sitting in recliner with all needs within reach, awaiting MD's arrival. 45 minutes missed of skilled physical  therapy.  ? ?Therapy Documentation ?Precautions:  ?Precautions ?Precautions: Fall ?Precaution Comments: decreased safety awareness, R crani ?Restrictions ?Weight Bearing Restrictions: No ? ?Therapy/Group: Individual Therapy ?Blenda Nicely ?Becky Sax PT, DPT  ? ?01/05/2022, 7:43 AM  ?

## 2022-01-05 NOTE — Plan of Care (Signed)
?  Problem: RH Stairs ?Goal: LTG Patient will ambulate up and down stairs w/assist (PT) ?Description: LTG: Patient will ambulate up and down # of stairs with assistance (PT) ?01/05/2022 1215 by Debbora Dus, PT ?Outcome: Completed/Met ?01/05/2022 1211 by Debbora Dus, PT ?Outcome: Not Met (add Reason) ?  ?

## 2022-01-05 NOTE — Progress Notes (Addendum)
Physical Therapy Discharge Summary ? ?Patient Details  ?Name: Randy Gallagher ?MRN: [id number] ?Date of Birth: 01-08-1971 ? ?Today's Date: 01/05/2022 ?PT Missed Time: 30 Minutes ?Missed Time Reason: Other (Comment);Unavailable (Comment) ?Daily session: Patient unexpectedly discharged home this AM- not present for PM therapy session ? ?Patient has met 9 of 9 long term goals due to improved activity tolerance, improved balance, improved postural control, increased strength, ability to compensate for deficits, improved attention, improved awareness, and improved coordination.  Patient to discharge at an ambulatory level Supervision.   Patient's care partner  did not participate in family education prior to dc  to provide the necessary physical and cognitive assistance at discharge. ? ? ?Recommendation:  ?Patient will benefit from ongoing skilled PT services in outpatient setting to continue to advance safe functional mobility, address ongoing impairments in awareness, dynamic balance, endurance, and minimize fall risk. ? ?Equipment: ?No equipment provided ? ?Reasons for discharge: treatment goals met and discharge from hospital ? ?Patient/family agrees with progress made and goals achieved: Yes ? ?PT Discharge ?Precautions/Restrictions ?Precautions ?Precautions: Fall ?Precaution Comments: decreased safety awareness, R crani ?Restrictions ?Weight Bearing Restrictions: No ?Pain ?  ?Pain Interference ?Pain Interference ?Pain Effect on Sleep: 0. Does not apply - I have not had any pain or hurting in the past 5 days ?Pain Interference with Therapy Activities: 0. Does not apply - I have not received rehabilitationtherapy in the past 5 days ?Pain Interference with Day-to-Day Activities: 1. Rarely or not at all ?Vision/Perception  ?Vision - History ?Ability to See in Adequate Light: 0 Adequate ?Perception ?Perception: Within Functional Limits ?Praxis ?Praxis: Intact  ?Cognition ?Overall Cognitive Status: Impaired/Different from  baseline ?Arousal/Alertness: Awake/alert ?Orientation Level: Oriented X4 ?Focused Attention: Appears intact ?Sustained Attention: Appears intact ?Selective Attention: Impaired ?Memory: Impaired ?Awareness: Impaired ?Problem Solving: Impaired ?Sequencing: Impaired ?Organizing: Impaired ?Safety/Judgment: Impaired ?Sensation ?Sensation ?Light Touch: Appears Intact ?Hot/Cold: Appears Intact ?Proprioception: Appears Intact ?Stereognosis: Appears Intact ?Coordination ?Gross Motor Movements are Fluid and Coordinated: No ?Coordination and Movement Description: grossly uncoordinated due to lateral postural sway during mobility ?Finger Nose Finger Test: [facility] ?Motor  ?Motor ?Motor: Other (comment) ?Motor - Skilled Clinical Observations: impaired due to postural sway with mobility and impaired balance ?Motor - Discharge Observations: Postural sway with general instability  ?Mobility ?Bed Mobility ?Bed Mobility: Supine to Sit;Sit to Supine;Rolling Right;Rolling Left ?Rolling Right: Independent ?Rolling Left: Independent ?Supine to Sit: Independent ?Sit to Supine: Independent ?Transfers ?Transfers: Sit to Stand;Stand to [employer] Transfers ?Sit to Stand: Supervision/Verbal cueing ?Stand to Sit: Supervision/Verbal cueing ?Stand Pivot Transfers: Supervision/Verbal cueing ?Transfer (Assistive device): None ?Locomotion  ?Gait ?Ambulation: Yes ?Gait Assistance: Supervision/Verbal cueing ?Assistive device: None ?Gait Assistance Details: Verbal cues for precautions/safety;Verbal cues for sequencing;Verbal cues for gait pattern ?Gait ?Gait: Yes ?Gait Pattern: Impaired ?Gait Pattern: Step-through pattern (postural sway) ?Gait velocity: decreased ?Stairs / Additional Locomotion ?Stairs: Yes ?Wheelchair Mobility ?Wheelchair Mobility: No  ?Trunk/Postural Assessment  ?Cervical Assessment ?Cervical Assessment: Exceptions to [facility] ?Thoracic Assessment ?Thoracic Assessment: Exceptions to [facility] ?Lumbar Assessment ?Lumbar Assessment: Within  Functional Limits ?Postural Control ?Postural Control: Deficits on evaluation ?Protective Responses: delayed but sufficient for minor LOB  ?Balance ?Balance ?Balance Assessed: Yes ?Standardized Balance Assessment ?Standardized Balance Assessment: Functional Gait Assessment ?Extremity Assessment  ?RLE Assessment ?RLE Assessment: Within Functional Limits ?General Strength Comments: grossly 5/5 throughout ?LLE Assessment ?LLE Assessment: Within Functional Limits ?General Strength Comments: grossly 5/5 throughout ? ? ?Debbora Dus ?01/05/2022, 9:03 AM ?

## 2022-01-05 NOTE — IPOC Note (Signed)
Overall Plan of Care (IPOC) ?Patient Details ?Name: Randy Gallagher ?MRN: [id number] ?DOB: 1971-09-18 ? ?Admitting Diagnosis: Brain lesion ? ?Hospital Problems: Principal Problem: ?  Brain lesion ?Active Problems: ?  Brain tumor ([place]) ? ? ? ? Functional Problem List: ?Nursing Bowel, Endurance, Safety, Medication Management, Pain  ?PT Balance, Behavior, Safety, Endurance, Skin Integrity, Motor, Pain, Nutrition, Edema  ?OT Balance, Cognition, Edema, Endurance, Motor, Safety  ?SLP Cognition  ?TR    ?    ? Basic ADL?s: ?OT Grooming, Bathing, Dressing, Toileting  ? ?  Advanced  ADL?s: ?OT    ?   ?Transfers: ?PT Bed Mobility, Bed to Chair, Car, Furniture, Floor  ?OT Toilet, Tub/Shower  ? ?  Locomotion: ?PT Ambulation, Stairs  ? ?  Additional Impairments: ?OT None  ?SLP Communication, Social Cognition ?expression ?Awareness, Attention, Memory, Problem Solving  ?TR    ? ? ?Anticipated Outcomes ?Item Anticipated Outcome  ?Self Feeding no goal  ?Swallowing ?   ?  ?Basic self-care ? Set up - Supervision  ?Toileting ? Mod I ?  ?Bathroom Transfers Set up - Supervision  ?Bowel/Bladder ? Manage bowel w mod I assist  ?Transfers ? mod-I  ?Locomotion ? supervision  ?Communication ? Mod I  ?Cognition ? supervison A- Mod I  ?Pain ? pain at or below level 4 wtih prns  ?Safety/Judgment ? maintain safety with cues  ? ?Therapy Plan: ?PT Intensity: Minimum of 1-2 x/day ,45 to 90 minutes ?PT Frequency: 5 out of 7 days ?PT Duration Estimated Length of Stay: ~5-7 days ?OT Intensity: Minimum of 1-2 x/day, 45 to 90 minutes ?OT Frequency: 5 out of 7 days ?OT Duration/Estimated Length of Stay: 5-7 days ?SLP Intensity: Minumum of 1-2 x/day, 30 to 90 minutes ?SLP Frequency: 3 to 5 out of 7 days ?SLP Duration/Estimated Length of Stay: 5-7 days  ? ?Due to the current state of emergency, patients may not be receiving their 3-hours of Medicare-mandated therapy. ? ? Team Interventions: ?Nursing Interventions Bowel Management, Patient/Family Education,  Medication Management, Disease Management/Prevention, Discharge Planning, Pain Management  ?PT interventions Ambulation/gait training, Community reintegration, DME/adaptive equipment instruction, Neuromuscular re-education, Psychosocial support, Stair training, UE/LE Strength taining/ROM, Training and development officer, Discharge planning, Pain management, Therapeutic Activities, Skin care/wound management, UE/LE Coordination activities, Cognitive remediation/compensation, Disease management/prevention, Functional mobility training, Patient/family education, Therapeutic Exercise, Visual/perceptual remediation/compensation  ?OT Interventions Training and development officer, DME/adaptive equipment instruction, Patient/family education, Therapeutic Activities, Therapeutic Exercise, Psychosocial support, Functional electrical stimulation, Cognitive remediation/compensation, Community reintegration, Functional mobility training, Self Care/advanced ADL retraining, UE/LE Strength taining/ROM, UE/LE Coordination activities, Skin care/wound managment, Neuromuscular re-education, Discharge planning, Disease mangement/prevention, Pain management, Visual/perceptual remediation/compensation  ?SLP Interventions Cognitive remediation/compensation, Cueing hierarchy, Environmental controls, Functional tasks, Internal/external aids, Speech/Language facilitation, Patient/family education  ?TR Interventions    ?SW/CM Interventions Discharge Planning, Psychosocial Support, Patient/Family Education  ? ?Barriers to Discharge ?MD  Medical stability  ?Nursing Decreased caregiver support ?1 level 3ste w bil rails, lives w sister (works)  ?PT Decreased caregiver support, Behavior ?decreased safety awareness  ?OT Decreased caregiver support ?   ?SLP Decreased caregiver support ?   ?SW Insurance for SNF coverage, Decreased caregiver support ?   ? ?Team Discharge Planning: ?Destination: PT-Home ,OT- Home , SLP-Home ?Projected Follow-up: PT-Outpatient  PT, 24 hour supervision/assistance, OT-  None, SLP-24 hour supervision/assistance, Home Health SLP ?Projected Equipment Needs: PT-To be determined, OT- To be determined, SLP-None recommended by SLP ?Equipment Details: PT- , OT-  ?Patient/family involved in discharge planning: PT- Patient,  OT-Patient, SLP-Patient ? ?MD ELOS: 4 days ?Medical  Rehab Prognosis:  Excellent ?Assessment: Mr. 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 Labs have been monitored and discussed with him. Staples to be removed today.  ? ? ?See Team Conference Notes for weekly updates to the plan of care  ?

## 2022-01-05 NOTE — Progress Notes (Incomplete)
Occupational Therapy Session Note ? ?Patient Details  ?Name: Randy Gallagher ?MRN: [id number] ?Date of Birth: [birth year] ? ?{CHL IP REHAB OT TIME CALCULATIONS:304400400} ? ? ?Short Term Goals: ?Week 1:  OT Short Term Goal 1 (Week 1): STG = LTG 2/2 LOS ? ?Skilled Therapeutic Interventions/Progress Updates:  ?   ? ?Therapy Documentation ?Precautions:  ?Precautions ?Precautions: Fall ?Precaution Comments: decreased safety awareness, R crani ?Restrictions ?Weight Bearing Restrictions: No ?General: ?General ?PT Missed Treatment Reason: Other (Comment) (waiting for MD for D/C) ?Vital Signs: ?  ?Pain: ?  ?ADL: ?ADL ?Eating: Not assessed ?Grooming: Supervision/safety ?Where Assessed-Grooming: Edge of bed ?Upper Body Bathing: Supervision/safety (standing at sink) ?Where Assessed-Upper Body Bathing: Standing at sink ?Lower Body Bathing: Contact guard (simulated) ?Where Assessed-Lower Body Bathing: Standing at sink ?Upper Body Dressing: Setup ?Lower Body Dressing: Contact guard ?Where Assessed-Lower Body Dressing: Edge of bed ?Toileting: Contact guard ?Toilet Transfer: Contact guard ?Vision ?Baseline Vision/History: 0 No visual deficits ?Patient Visual Report: No change from baseline ?Vision Assessment?: No apparent visual deficits ?Perception  ?Perception: Within Functional Limits ?Praxis ?Praxis: Intact ?Balance ?Balance ?Balance Assessed: Yes ?Standardized Balance Assessment ?Standardized Balance Assessment: Functional Gait Assessment ?Exercises: ?  ?Other Treatments:   ? ? ?Therapy/Group: Individual Therapy ? ?[place] ?01/05/2022, 10:29 AM ?

## 2022-01-05 NOTE — Progress Notes (Signed)
Staples removed per order at this time. Pt tolerated well, incision edges approximated, no drainage.  ?

## 2022-01-06 LAB — SURGICAL PATHOLOGY

## 2022-01-06 NOTE — Progress Notes (Signed)
Inpatient Rehabilitation Discharge Medication Review by a Pharmacist ? ?A complete drug regimen review was completed for this patient to identify any potential clinically significant medication issues. ? ?High Risk Drug Classes Is patient taking? Indication by Medication  ?Antipsychotic No   ?Anticoagulant No   ?Antibiotic No   ?Opioid No   ?Antiplatelet No   ?Hypoglycemics/insulin No   ?Vasoactive Medication No   ?Chemotherapy No   ?Other No   ? ? ? ?Type of Medication Issue Identified Description of Issue Recommendation(s)  ?Drug Interaction(s) (clinically significant) ?    ?Duplicate Therapy ?    ?Allergy ?    ?No Medication Administration End Date ?    ?Incorrect Dose ?    ?Additional Drug Therapy Needed ?    ?Significant med changes from prior encounter (inform family/care partners about these prior to discharge).    ?Other ?    ? ? ?Clinically significant medication issues were identified that warrant physician communication and completion of prescribed/recommended actions by midnight of the next day:  No ? ?Name of provider notified for urgent issues identified:  ? ?Provider Method of Notification:  ? ? ? ?Pharmacist comments:  ? ?Time spent performing this drug regimen review (minutes):  5 ? ? ?Randy Gallagher BS, PharmD, BCPS ?Clinical Pharmacist ?01/06/2022 9:44 AM ? ? ? ? ?

## 2022-01-09 ENCOUNTER — Encounter (HOSPITAL_COMMUNITY): Payer: Self-pay

## 2022-01-09 ENCOUNTER — Inpatient Hospital Stay: Payer: BC Managed Care – PPO | Attending: Internal Medicine

## 2022-01-09 ENCOUNTER — Telehealth: Payer: Self-pay

## 2022-01-09 NOTE — Telephone Encounter (Signed)
Transitional Care call--who you talked with ? ? ? ?Are you/is patient experiencing any problems since coming home? Are there any questions regarding any aspect of care?  NO ?Are there any questions regarding medications administration/dosing? NO Are meds being taken as prescribed? YES Patient should review meds with caller to confirm ?Have there been any falls? NO ?Has Home Health been to the house and/or have they contacted you? [facility] If not, have you tried to contact them? Can we help you contact them? ?Are bowels and bladder emptying properly? YES Are there any unexpected incontinence issues? NO If applicable, is patient following bowel/bladder programs? ?Any fevers, problems with breathing, unexpected pain? NO ?Are there any skin problems or new areas of breakdown? NO ?Has the patient/family member arranged specialty MD follow up (ie cardiology/neurology/renal/surgical/etc)?  YES Can we help arrange? ?Does the patient need any other services or support that we can help arrange? NO ?Are caregivers following through as expected in assisting the patient? YES ?Has the patient quit smoking, drinking alcohol, or using drugs as recommended? NO ? ?Appointment time, arrive time and who it is with here 01/19/22 at 10:20 arrival at 10:00 with Danella Sensing, NP ?[address]   ?

## 2022-01-10 ENCOUNTER — Telehealth: Payer: Self-pay | Admitting: Radiation Therapy

## 2022-01-10 ENCOUNTER — Other Ambulatory Visit: Payer: Self-pay | Admitting: Radiation Therapy

## 2022-01-10 ENCOUNTER — Telehealth: Payer: Self-pay | Admitting: Internal Medicine

## 2022-01-10 DIAGNOSIS — C719 Malignant neoplasm of brain, unspecified: Secondary | ICD-10-CM

## 2022-01-10 NOTE — Telephone Encounter (Signed)
Spoke with pt. about our brain and spine conference discussion and recommendation for treatment. He has a consult scheduled to meet with Dr. Mickeal Skinner and Dr. Lisbeth Renshaw on Thursday 3/16. During our conversation, Mr. Odor expressed his desires to NOT have treatment, or have ongoing appointments with doctors. He stated he just wants to get back to work and have things be normal again. He does not understand why he cannot drive, stating that he is a good driver, and he feels he is doing much better now that he has returned home from the hospital.He also mentioned that he lives with a sister who is bipolar and he does not have other support at home. ? ?I have encouraged Mr. Hagerman to keep the 3/9 follow-up with Dr. Annette Stable and also to come in and see Dr. Mickeal Skinner and Dr. Lisbeth Renshaw on 3/16. During the 3/16 visit, cancer center social workers are planning to see him for an extra layer or support.  ? ?At the end of our conversation I am not confident that Mr. Sanderson understands what we talked about or the significance of coming in for follow-up and treatment after surgery. I left a voicemail with Dr. Marchelle Folks assistant, Caryl Pina, to let her know about Mr. Riedl confusion about his appointment dates and times for Dr. Annette Stable, so she too is aware of the non-compliance risk, and need for reminders.  ? ?Mont Dutton R.T.(R)(T) ?Radiation Special Procedures Navigator  ?

## 2022-01-10 NOTE — Telephone Encounter (Signed)
Error

## 2022-01-10 NOTE — Telephone Encounter (Signed)
Scheduled appt per 3/7 referral. Pt is aware of appt date and time. Pt is aware to arrive 15 mins prior to appt time and to bring and updated insurance card. Pt is aware of appt location.   

## 2022-01-17 ENCOUNTER — Telehealth: Payer: Self-pay | Admitting: Internal Medicine

## 2022-01-17 NOTE — Telephone Encounter (Signed)
Pt called in requesting to cancel his appt's with [facility] and close out his referral. I cancelled his appt and closed the referral per pt request.  ?

## 2022-01-18 ENCOUNTER — Telehealth: Payer: Self-pay

## 2022-01-18 NOTE — Telephone Encounter (Signed)
Randy Gallagher was referred to our office from radiation-oncology. We were aware that he had denied any treatment options here. I called Randy Gallagher to see if we would be able to assist him with any symptom management needs. He stated that he was doing fine at the moment and had recently returned to work. He stated that he did have a discussion with Dr. Annette Stable yesterday and understood his diagnosis and prognosis. He again stated that he didn't want treatment. I gave him our number to call in the future should any symptoms arise or if he needs [place] to connect him to outpatient resources. Understanding and appreciation verbalized. All questions answered. Advised to call us with any questions/concerns.  ?Notified Randy Gallagher treatment team.  ?

## 2022-01-19 ENCOUNTER — Inpatient Hospital Stay: Payer: BC Managed Care – PPO | Admitting: Internal Medicine

## 2022-01-19 ENCOUNTER — Ambulatory Visit: Payer: BC Managed Care – PPO | Admitting: Radiation Oncology

## 2022-01-19 ENCOUNTER — Encounter: Payer: BC Managed Care – PPO | Attending: Registered Nurse | Admitting: Registered Nurse

## 2022-01-19 ENCOUNTER — Inpatient Hospital Stay: Payer: BC Managed Care – PPO | Admitting: *Deleted

## 2022-01-31 ENCOUNTER — Ambulatory Visit (HOSPITAL_COMMUNITY): Payer: BC Managed Care – PPO

## 2022-01-31 ENCOUNTER — Ambulatory Visit (HOSPITAL_COMMUNITY): Payer: BC Managed Care – PPO | Admitting: Speech Pathology

## 2022-02-15 ENCOUNTER — Ambulatory Visit: Payer: BC Managed Care – PPO | Admitting: Family Medicine

## 2022-02-15 DIAGNOSIS — C719 Malignant neoplasm of brain, unspecified: Secondary | ICD-10-CM | POA: Diagnosis not present

## 2022-02-15 DIAGNOSIS — K219 Gastro-esophageal reflux disease without esophagitis: Secondary | ICD-10-CM | POA: Insufficient documentation

## 2022-02-15 DIAGNOSIS — Z720 Tobacco use: Secondary | ICD-10-CM | POA: Diagnosis not present

## 2022-02-15 NOTE — Assessment & Plan Note (Signed)
Stable.  Patient is going to continue his over-the-counter cimetidine ?

## 2022-02-15 NOTE — Progress Notes (Signed)
? ?  Subjective:  ?Patient ID: Randy Gallagher, male    DOB: February 25, 1971  Age: 51 y.o. MRN: [id number] ? ?CC: Establish care ? ?HPI ?Randy Gallagher is a 51 y.o. male presents to the clinic today to establish care. ? ?Patient recently found to have a brain tumor which has turned out to be glioblastoma.  He has had craniotomy and debulking of his tumor.  He has refused postoperative treatment regarding his tumor.  I discussed this with him again today and he does not want treatment.  Offered hospice care and he declines this as well. ? ?He states that he has mild headaches.  Responds to ibuprofen.  He is not on any prescription medications.  He takes famotidine for reflux.  Continues to smoke.  He is otherwise feeling well.  He has upcoming follow-up with his neurosurgeon next week. ? ?History reviewed and updated as below. ? ?PMH:  ?Glioblastoma, GERD, tobacco abuse ? ?Past Surgical History:  ?Procedure Laterality Date  ? CRANIOTOMY Right 12/27/2021  ? Procedure: CRANIOTOMY TUMOR EXCISION;  Surgeon: Earnie Larsson, MD;  Location: [facility];  Service: Neurosurgery;  Laterality: Right;  brain lab  ? ?Social History  ? ?Tobacco Use  ? Smoking status: Every Day  ?  Packs/day: 1.00  ?  Types: Cigarettes  ? Smokeless tobacco: Never  ?Substance Use Topics  ? Alcohol use: Never  ? ? ?Review of Systems ?Per HPI ? ?Objective:  ? ?Today's Vitals: BP 120/70   Pulse (!) 102   Temp 98.1 ?F (36.7 ?C) (Oral)   Ht 5' 10.5" (1.791 m)   Wt 188 lb (85.3 kg)   SpO2 97%   BMI 26.59 kg/m?  ? ?Physical Exam ?Vitals and nursing note reviewed.  ?Constitutional:   ?   General: He is not in acute distress. ?HENT:  ?   Head:  ?   Comments: Forehead with raised area which is soft to palpation.  Likely cyst. ?   Nose: Nose normal.  ?   Mouth/Throat:  ?   Mouth: Mucous membranes are moist.  ?   Pharynx: Oropharynx is clear.  ?Eyes:  ?   General:     ?   Right eye: No discharge.     ?   Left eye: No discharge.  ?   Conjunctiva/sclera: Conjunctivae normal.  ?    Pupils: Pupils are equal, round, and reactive to light.  ?Cardiovascular:  ?   Rate and Rhythm: Normal rate and regular rhythm.  ?Pulmonary:  ?   Effort: Pulmonary effort is normal.  ?   Breath sounds: Normal breath sounds. No wheezing, rhonchi or rales.  ?Neurological:  ?   General: No focal deficit present.  ?   Mental Status: He is alert.  ?Psychiatric:     ?   Mood and Affect: Mood normal.     ?   Behavior: Behavior normal.  ? ? ? ?Assessment & Plan:  ? ?Problem List Items Addressed This Visit   ? ?  ? Digestive  ? GERD (gastroesophageal reflux disease)  ?  Stable.  Patient is going to continue his over-the-counter cimetidine ?  ?  ? Relevant Medications  ? EQ CIMETIDINE PO  ?  ? Other  ? Glioblastoma ([place])  ?  Patient has refused oncological treatment.  Patient also does not want hospice treatment at this time.  Poor prognosis. ?  ?  ? Tobacco abuse  ? ?Thersa Salt DO ?[place] ? ? ? ? ?

## 2022-02-15 NOTE — Patient Instructions (Signed)
If you change your mind regarding treatment or hospice, I am here to help. ? ?Call with concerns. ? ?Take care ? ?Dr. Lacinda Axon  ?

## 2022-02-15 NOTE — Assessment & Plan Note (Signed)
Patient has refused oncological treatment.  Patient also does not want hospice treatment at this time.  Poor prognosis. ?

## 2022-03-02 ENCOUNTER — Telehealth: Payer: Self-pay | Admitting: *Deleted

## 2022-03-02 NOTE — Telephone Encounter (Addendum)
Patient states he would like to transfer neurology to [facility] neurology Dr Merlene Laughter. He said it is closer for him and he does not want to continue see Dr Annette Stable ? ?Patient is requesting a call from Dr Lacinda Axon if possible ?

## 2022-03-03 ENCOUNTER — Other Ambulatory Visit: Payer: Self-pay | Admitting: Family Medicine

## 2022-03-03 DIAGNOSIS — C719 Malignant neoplasm of brain, unspecified: Secondary | ICD-10-CM

## 2022-03-03 NOTE — Telephone Encounter (Signed)
Coral Spikes, DO   ? ?Called patient no answer.  ?I will try and touch base with neurosurgery.  ? ?

## 2022-03-06 ENCOUNTER — Ambulatory Visit (INDEPENDENT_AMBULATORY_CARE_PROVIDER_SITE_OTHER): Payer: BC Managed Care – PPO | Admitting: Family Medicine

## 2022-03-06 VITALS — BP 138/83 | HR 92 | Temp 98.3°F | Ht 70.5 in | Wt 187.8 lb

## 2022-03-06 DIAGNOSIS — S0990XA Unspecified injury of head, initial encounter: Secondary | ICD-10-CM | POA: Insufficient documentation

## 2022-03-06 NOTE — Assessment & Plan Note (Addendum)
Given patient's injury, I recommended stat CT head.  Patient declined/refused.  I had a lengthy discussion with the patient regarding urgent need for CT to rule out significant pathology especially in the setting of known glioblastoma.  I advised the patient to reconsider.  Patient again refused.  He did state that he will go home and reconsider.  Tylenol as needed.  Supportive care. ?

## 2022-03-06 NOTE — Progress Notes (Addendum)
? ?Subjective:  ?Patient ID: Randy Gallagher, male    DOB: 03-02-1971  Age: 51 y.o. MRN: [id number] ? ?CC: ?Chief Complaint  ?Patient presents with  ? Fall  ?  Yesterday morning. 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 Area was swollen and painful.  Balance to stay straight was off. He applied ice for about 2 hours.   ? ? ?HPI: ? ?51 year old male with glioblastoma, GERD, and tobacco abuse presents for evaluation of the above. ? 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  In doing so he twisted his ankle and subsequently fell and hit the right side of his head on the side of the forklift.  He states that he had swelling in the area and associated pain.  Also had difficulty with balance.  Patient informs me that he still feels slightly dizzy.  He reports that he is not following this is Workmen's Comp.  I advised him that he should reconsider this as that is the primary role of Workmen's Comp.  No current headache.  He states that the swelling has improved.  No vision changes.  No other complaints. ? ?Patient Active Problem List  ? Diagnosis Date Noted  ? Head injury 03/06/2022  ? Glioblastoma ([place]) 02/15/2022  ? GERD (gastroesophageal reflux disease) 02/15/2022  ? Tobacco abuse 02/15/2022  ? ? ?Social Hx   ?Social History  ? ?Socioeconomic History  ? Marital status: Divorced  ?  Spouse name: Not on file  ? Number of children: Not on file  ? Years of education: Not on file  ? Highest education level: Not on file  ?Occupational History  ? Not on file  ?Tobacco Use  ? Smoking status: Every Day  ?  Packs/day: 1.00  ?  Types: Cigarettes  ? Smokeless tobacco: Never  ?Vaping Use  ? Vaping Use: Never used  ?Substance and Sexual Activity  ? Alcohol use: Never  ? Drug use: Not Currently  ? Sexual activity: Not on file  ?Other Topics Concern  ? Not on file  ?Social History Narrative  ? Not on file  ? ?Social Determinants of Health  ? ?Financial Resource Strain:  Not on file  ?Food Insecurity: Not on file  ?Transportation Needs: Not on file  ?Physical Activity: Not on file  ?Stress: Not on file  ?Social Connections: Not on file  ? ? ?Review of Systems ?Per HPI ? ?Objective:  ?BP 138/83   Pulse 92   Temp 98.3 ?F (36.8 ?C) (Oral)   Ht 5' 10.5" (1.791 m)   Wt 187 lb 12.8 oz (85.2 kg)   SpO2 98%   BMI 26.57 kg/m?  ? ? ?  03/06/2022  ?  9:19 AM 02/15/2022  ? 10:23 AM 01/05/2022  ?  3:13 AM  ?BP/Weight  ?Systolic BP 578 469 629  ?Diastolic BP 83 70 79  ?Wt. (Lbs) 187.8 188   ?BMI 26.57 kg/m2 26.59 kg/m2   ? ? ?Physical Exam ?Vitals and nursing note reviewed.  ?Constitutional:   ?   General: He is not in acute distress. ?HENT:  ?   Head: Normocephalic and atraumatic.  ?Eyes:  ?   General:     ?   Right eye: No discharge.     ?   Left eye: No discharge.  ?   Conjunctiva/sclera: Conjunctivae normal.  ?Cardiovascular:  ?   Rate and Rhythm: Normal rate and regular rhythm.  ?  Pulmonary:  ?   Effort: Pulmonary effort is normal. No respiratory distress.  ?Neurological:  ?   General: No focal deficit present.  ?   Mental Status: He is alert.  ? ? ?Lab Results  ?Component Value Date  ? WBC 20.2 (H) 01/03/2022  ? HGB 16.9 01/03/2022  ? HCT 51.5 01/03/2022  ? PLT 391 01/03/2022  ? GLUCOSE 134 (H) 01/03/2022  ? ALT 40 01/03/2022  ? AST 18 01/03/2022  ? NA 137 01/03/2022  ? K 4.1 01/03/2022  ? CL 103 01/03/2022  ? CREATININE 0.81 01/03/2022  ? BUN 16 01/03/2022  ? CO2 26 01/03/2022  ? ? ? ?Assessment & Plan:  ? ?Problem List Items Addressed This Visit   ? ?  ? Other  ? Head injury - Primary  ?  Given patient's injury, I recommended stat CT head.  Patient declined/refused.  I had a lengthy discussion with the patient regarding urgent need for CT to rule out significant pathology especially in the setting of known glioblastoma.  I advised the patient to reconsider.  Patient again refused.  He did state that he will go home and reconsider.  Tylenol as needed.  Supportive care. ? ?  ?  ? ?Thersa Salt DO ?[place] ? ?

## 2022-03-06 NOTE — Telephone Encounter (Signed)
Coral Spikes, DO   ? ?Spoke with patient. Placed referral per his request.   ? ?

## 2022-03-06 NOTE — Patient Instructions (Signed)
I recommend that you have a CT for further evaluation. Please reconsider. ? ?Rest and take it easy today. ? ?Take care ? ?Dr. Lacinda Axon  ?

## 2022-03-07 ENCOUNTER — Other Ambulatory Visit: Payer: Self-pay | Admitting: Family Medicine

## 2022-03-07 ENCOUNTER — Telehealth: Payer: Self-pay

## 2022-03-07 ENCOUNTER — Ambulatory Visit (HOSPITAL_COMMUNITY)
Admission: RE | Admit: 2022-03-07 | Discharge: 2022-03-07 | Disposition: A | Discharge status: Home / Self Care | Payer: BC Managed Care – PPO | Source: Ambulatory Visit | Attending: Family Medicine | Admitting: Family Medicine

## 2022-03-07 DIAGNOSIS — C719 Malignant neoplasm of brain, unspecified: Secondary | ICD-10-CM

## 2022-03-07 DIAGNOSIS — S0990XA Unspecified injury of head, initial encounter: Secondary | ICD-10-CM

## 2022-03-07 DIAGNOSIS — R42 Dizziness and giddiness: Secondary | ICD-10-CM | POA: Diagnosis not present

## 2022-03-07 DIAGNOSIS — G936 Cerebral edema: Secondary | ICD-10-CM | POA: Diagnosis not present

## 2022-03-07 DIAGNOSIS — R930 Abnormal findings on diagnostic imaging of skull and head, not elsewhere classified: Secondary | ICD-10-CM | POA: Diagnosis not present

## 2022-03-07 DIAGNOSIS — Z85841 Personal history of malignant neoplasm of brain: Secondary | ICD-10-CM | POA: Diagnosis not present

## 2022-03-07 IMAGING — MR MR HEAD WO/W CM
13 of 15 series · 36 of 48 positions shown · IV contrast (gadavist)
Comparison: Same-day CT head, brain MRI [DATE]

CLINICAL DATA: Recent head injury, glioblastoma status post
resection

EXAM:
MRI HEAD WITHOUT AND WITH CONTRAST
TECHNIQUE: Multiplanar, multiecho pulse sequences of the brain and surrounding
structures were obtained without and with intravenous contrast.
CONTRAST:  10mL GADAVIST GADOBUTROL 1 MMOL/ML IV SOLN

[Series 5: DWI · axial · 4.0mm · 0.88mm/px · z∈[-91,+41]mm · 3 of 36 slices shown (1 of 6)]
[im 1/36]
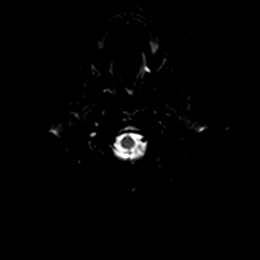
[im 18/36]
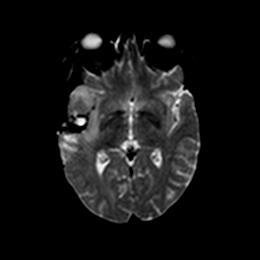
[im 36/36]
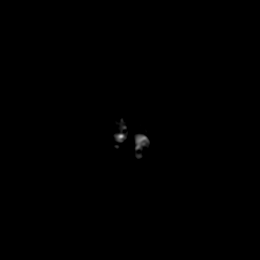

[Series 5: DWI · axial · 4.0mm · 0.88mm/px · z∈[-91,+41]mm · 3 of 36 slices shown (2 of 6)]
[im 1/36]
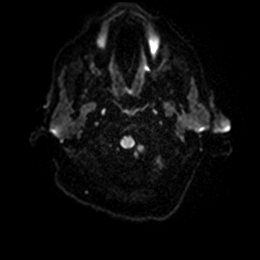
[im 18/36]
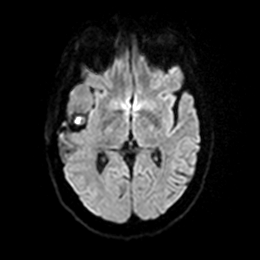
[im 36/36]
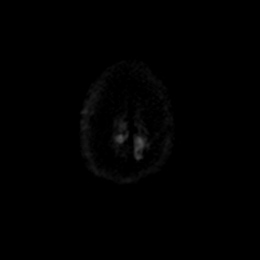

[Series 6: DWI · axial · 4.0mm · 0.88mm/px · z∈[-91,+41]mm · 4 of 36 slices shown (3 of 6)]
[im 1/36]
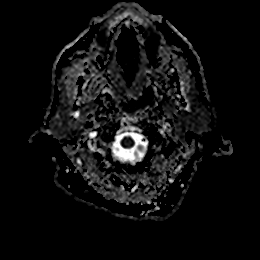
[im 12/36]
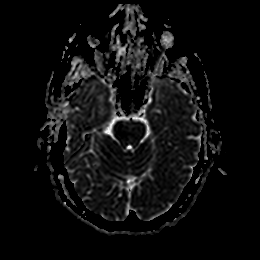
[im 24/36]
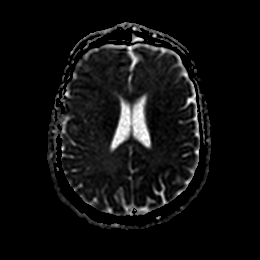
[im 36/36]
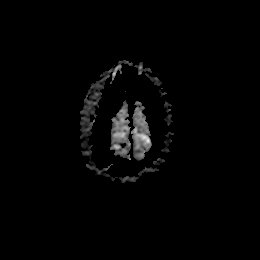

[Series 7: DWI · coronal · 5.0mm · 0.88mm/px · 3 of 28 slices shown (4 of 6)]
[im 1/28]
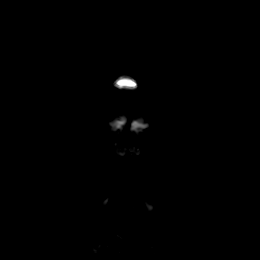
[im 14/28]
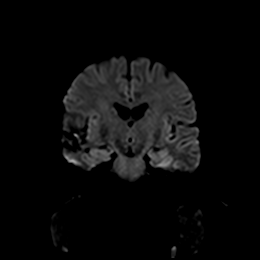
[im 28/28]
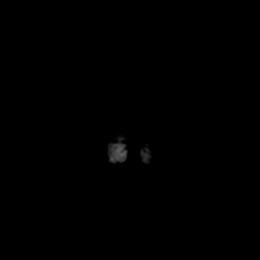

[Series 7: DWI · coronal · 5.0mm · 0.88mm/px · 3 of 28 slices shown (5 of 6)]
[im 1/28]
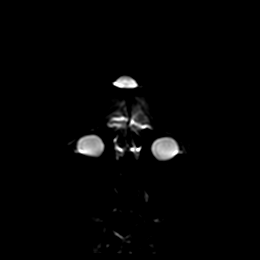
[im 14/28]
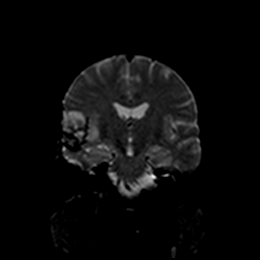
[im 28/28]
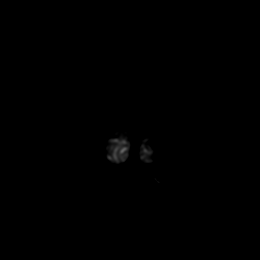

[Series 8: DWI · coronal · 5.0mm · 0.88mm/px · 3 of 28 slices shown (6 of 6)]
[im 1/28]
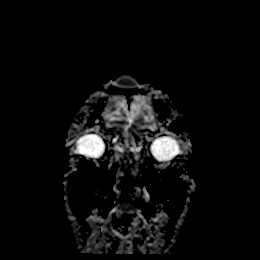
[im 14/28]
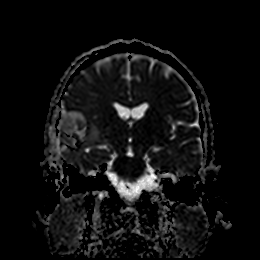
[im 28/28]
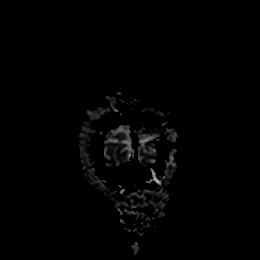

[Series 9: T1 · sagittal · 5.0mm · 0.94mm/px · 2 of 21 slices shown (1 of 2)]
[im 1/21]
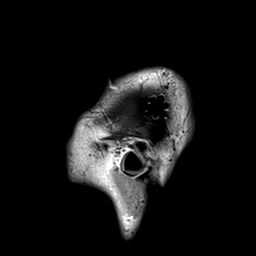
[im 21/21]
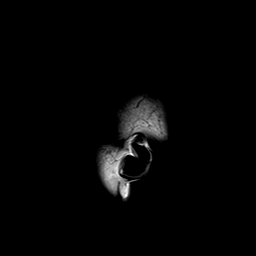

[Series 10: T2 · axial · 5.0mm · 0.72mm/px · z∈[-87,+37]mm · 2 of 20 slices shown (1 of 2)]
[im 1/20]
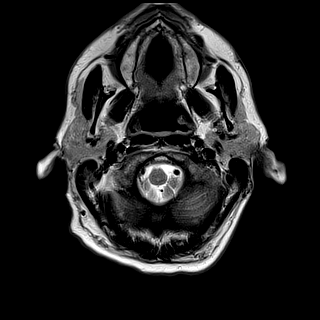
[im 20/20]
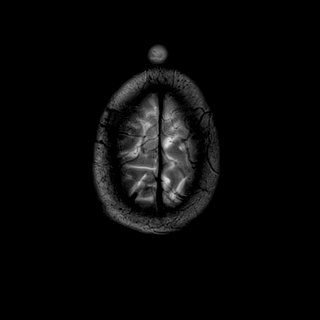

[Series 11: ax hemo · axial · 5.0mm · 0.86mm/px · z∈[-88,+47]mm · 2 of 25 slices shown]
[im 1/25]
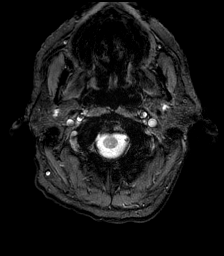
[im 25/25]
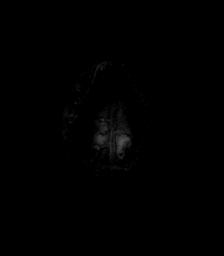

[Series 12: FLAIR · axial · 4.0mm · 0.43mm/px · z∈[-79,+37]mm · 3 of 32 slices shown]
[im 1/32]
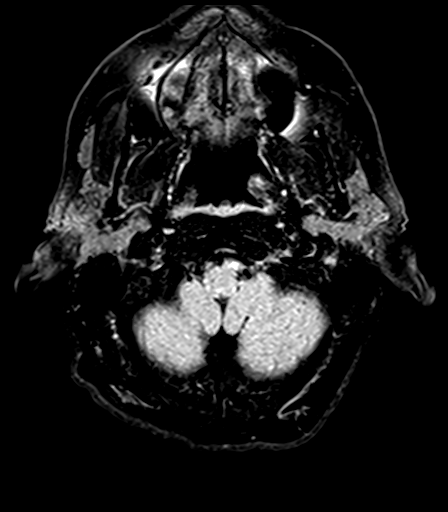
[im 16/32]
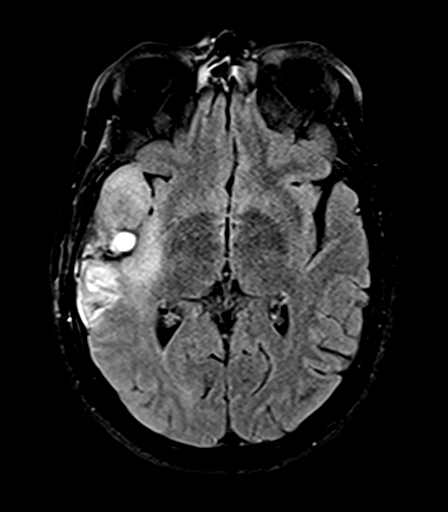
[im 32/32]
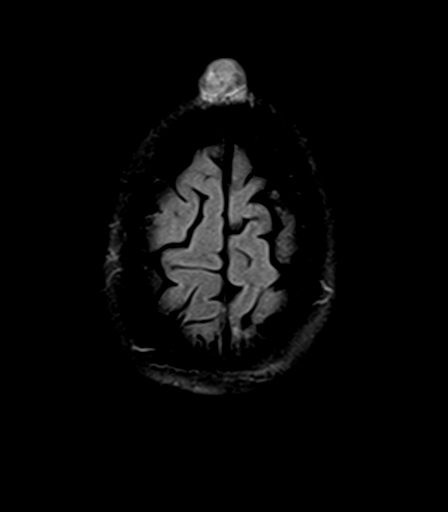

[Series 18: T2 · coronal · 5.0mm · 0.72mm/px · 3 of 28 slices shown (2 of 2)]
[im 1/28]
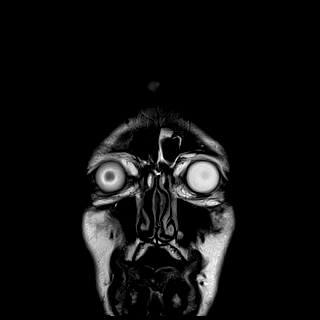
[im 14/28]
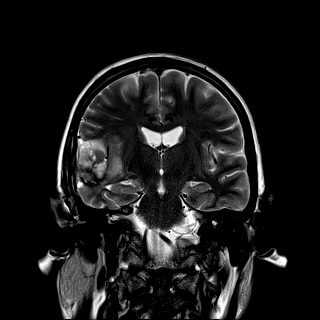
[im 28/28]
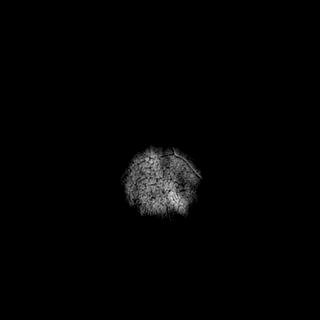

[Series 20: T1 post-contrast · coronal · 5.0mm · 0.34mm/px · 3 of 29 slices shown]
[im 1/29]
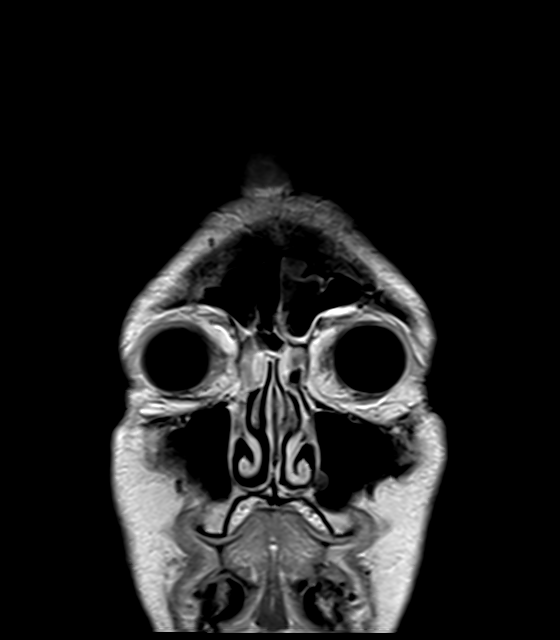
[im 15/29]
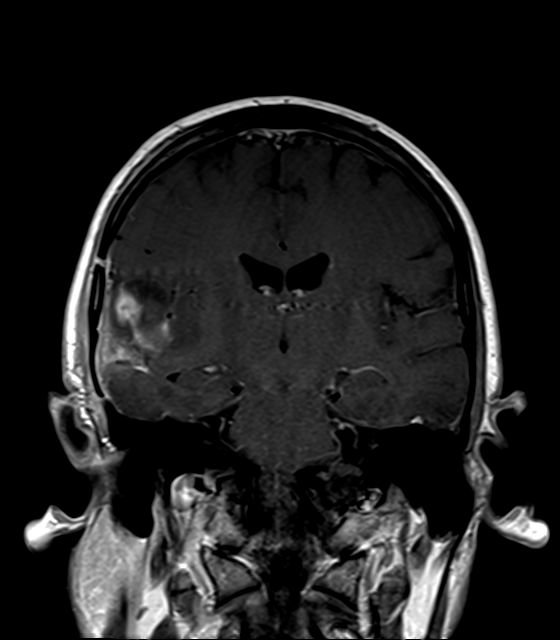
[im 29/29]
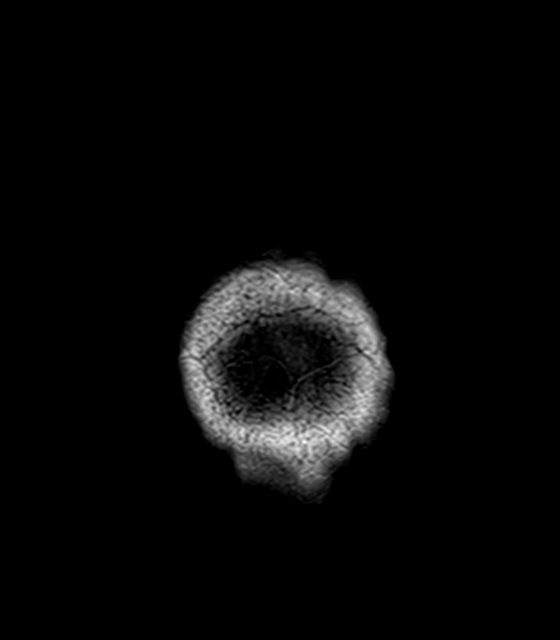

[Series 21: T1 · sagittal · 5.0mm · 0.94mm/px · 2 of 25 slices shown (2 of 2)]
[im 1/25]
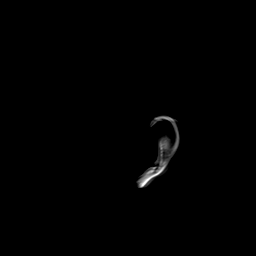
[im 25/25]
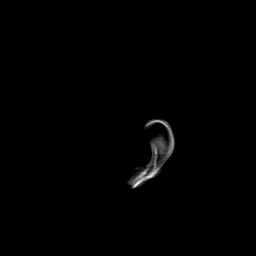

[36 of 48 positions shown; findings below may reference images not displayed]

FINDINGS: Brain: Postsurgical changes reflecting right craniotomy for GBM
resection are again seen. There is a thin extra-axial fluid
collection underlying the craniotomy, not significantly changed in
thickness since the immediate postoperative study

Since the MRI from [DATE], the resection cavity has contracted,
with markedly improved surrounding vasogenic edema and essentially
resolved leftward midline shift. There is persistent FLAIR signal
abnormality in the temporal lobe and insula. SWI signal dropout at
the periphery of the cavity as well as intrinsically T1 hyperintense
material within the cavity are consistent with evolving blood
products.

There is a 1.2 cm AP x 0.9 cm TV focus of nodular enhancement along
the superior resection margin as well as curvilinear and nodular

There is no evidence of acute intracranial hemorrhage or acute
extra-axial fluid collection. Parenchymal signal is otherwise within
normal limits.

Vascular: Normal flow voids.

Skull and upper cervical spine: Normal marrow signal.

Sinuses/Orbits: The paranasal sinuses are clear. The globes and
orbits are unremarkable.

Other: The probable epidermal inclusion cyst in the midline scalp
anteriorly is unchanged. Postoperative fluid in the right scalp has
resolved.
IMPRESSION: 1. Evolving postsurgical changes reflecting right temporal GBM
resection with markedly improved edema surrounding the resection
cavity and resolved leftward midline shift.
2. Nodular enhancement about the resection margin as above is
suspicious for residual tumor. Persistent FLAIR signal abnormality
is seen around the cavity in the temporal lobe and insula.

## 2022-03-07 IMAGING — CT CT HEAD W/O CM
4 of 5 series · 14 of 47 positions shown, 16 images · non-contrast
Comparison: Multiple exams, including MRI brain [DATE]

CLINICAL DATA: Head injury and dizziness. Fall striking the right
side of the head on a steel beam. Right temporal glioblastoma
multiforme.



[Series 2: head w o · axial · 0.40mm/px · z∈[+50,+150]mm · 5 of 32 slices shown, 7 images]
[im 6/32  brain]
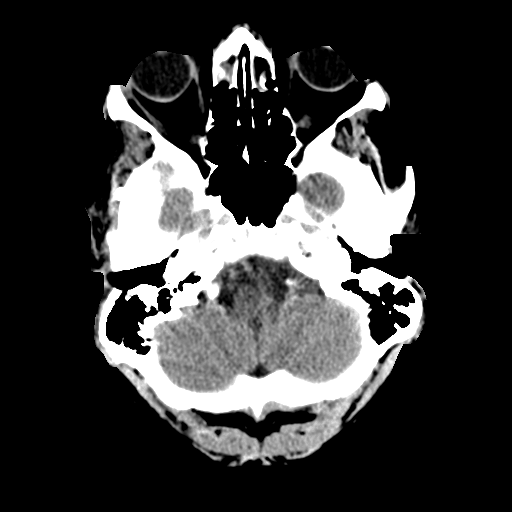
[im 6/32  bone]
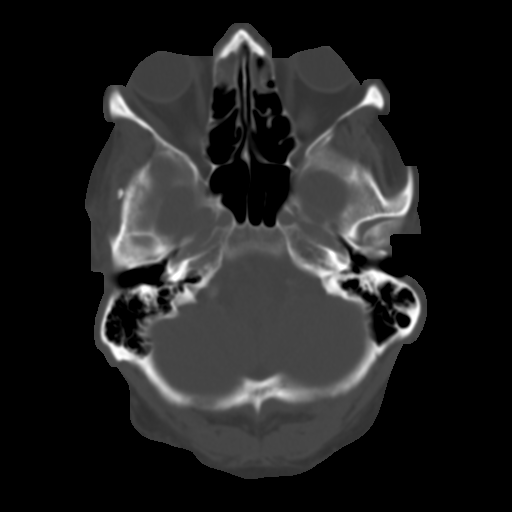
[im 11/32  brain]
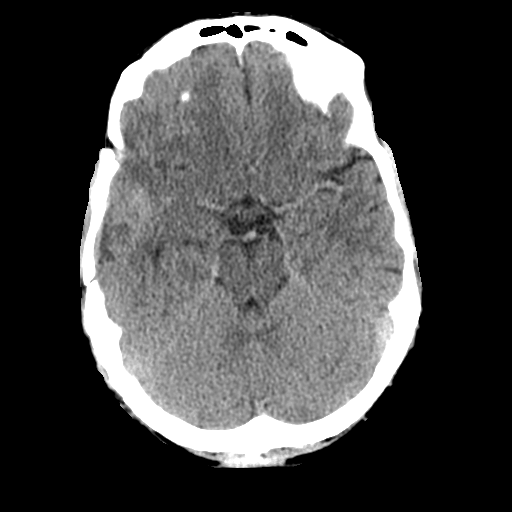
[im 16/32  brain]
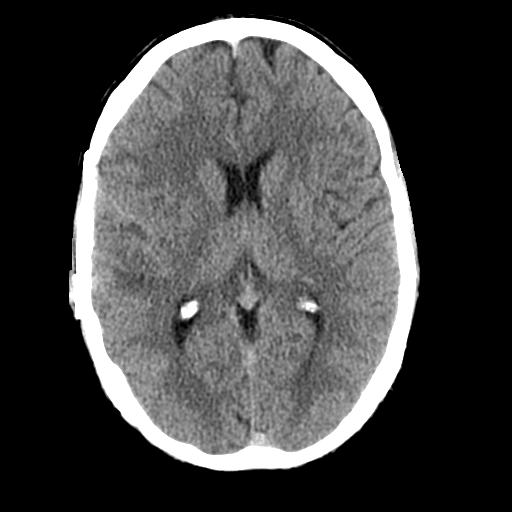
[im 21/32  brain]
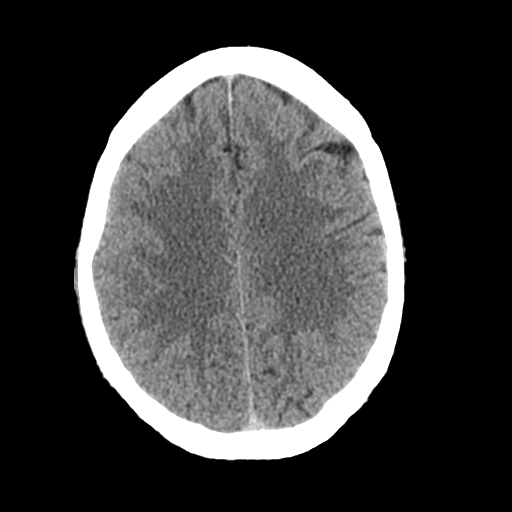
[im 26/32  brain]
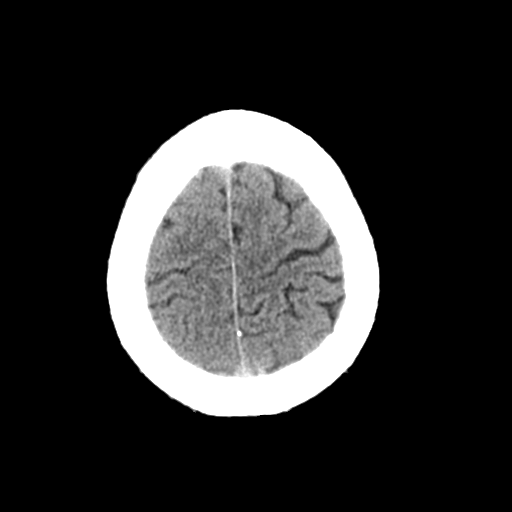
[im 26/32  bone]
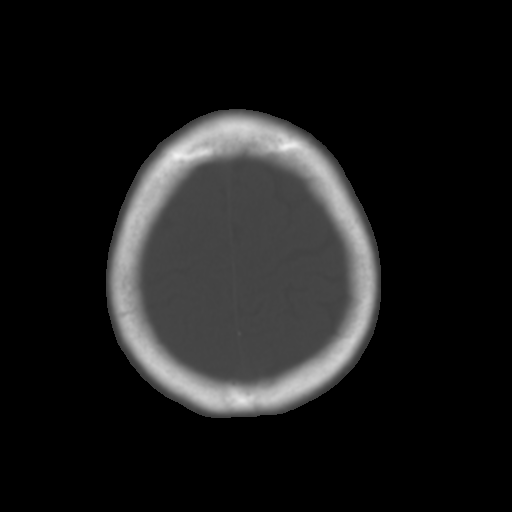

[Series 4: coronal soft · coronal · 0.33mm/px · 3 of 84 slices shown]
[im 28/84  brain]
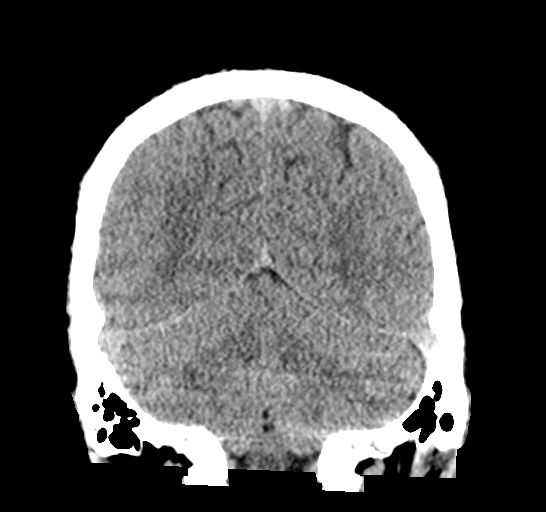
[im 37/84  brain]
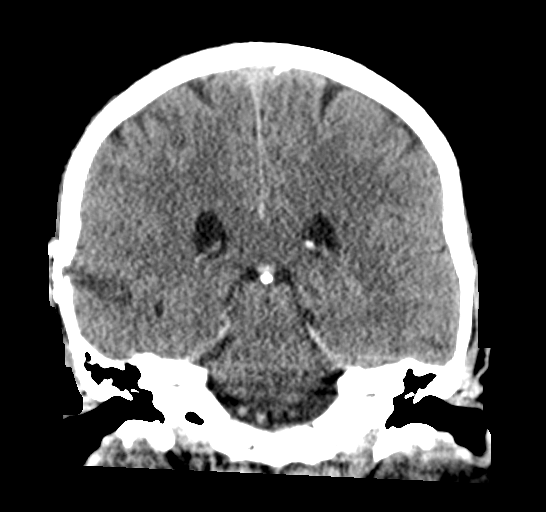
[im 47/84  brain]
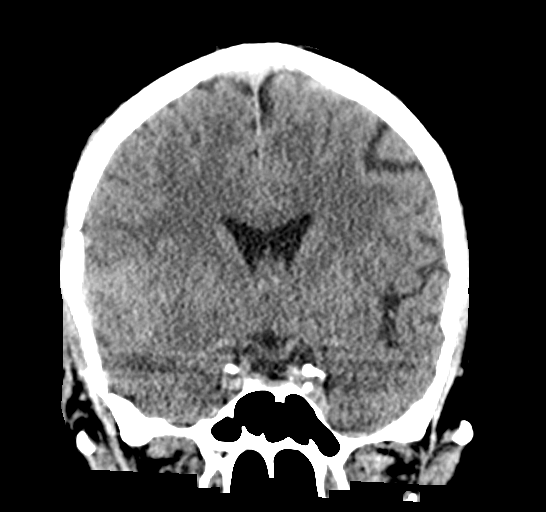

[Series 5: sagittal soft · sagittal · 0.36mm/px · 3 of 62 slices shown]
[im 21/62  brain]
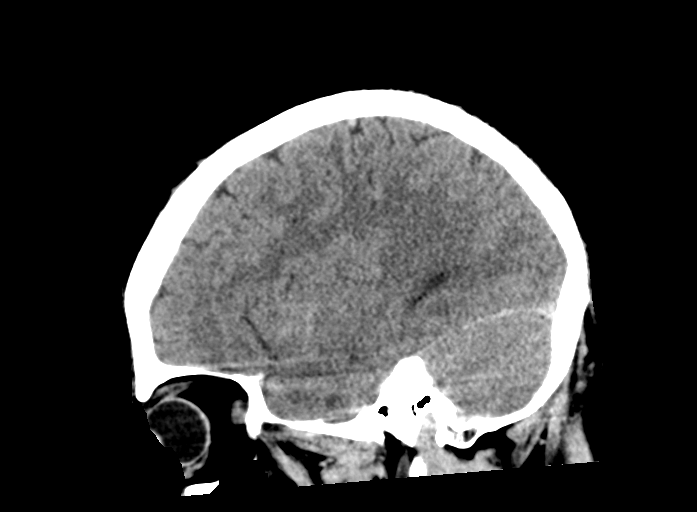
[im 31/62  brain]
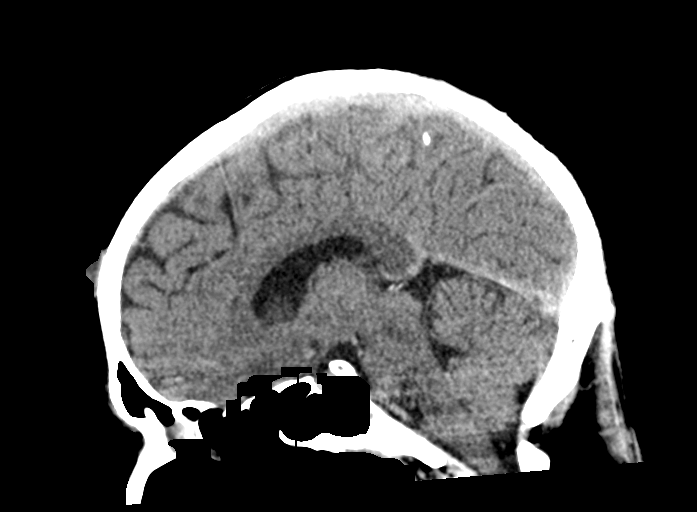
[im 41/62  brain]
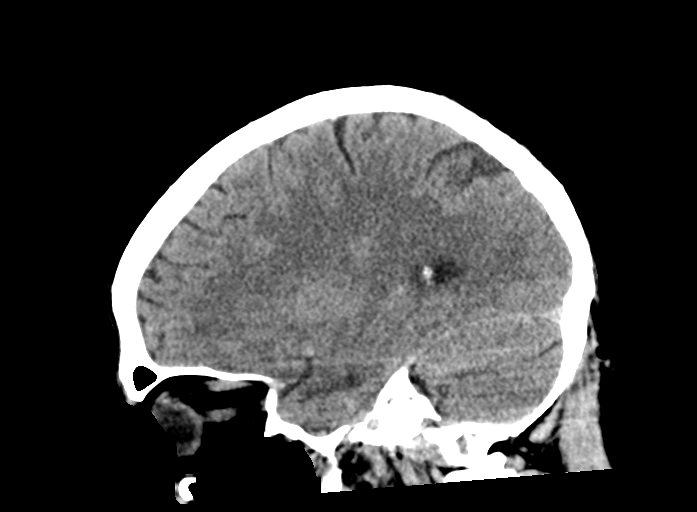

[Series 6: head ax w o · axial · 0.35mm/px · z∈[+42,+101]mm · 3 of 35 slices shown]
[im 6/35  brain]
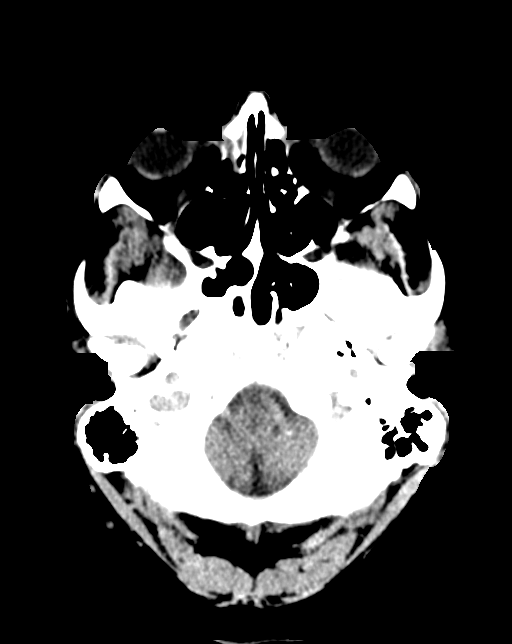
[im 12/35  brain]
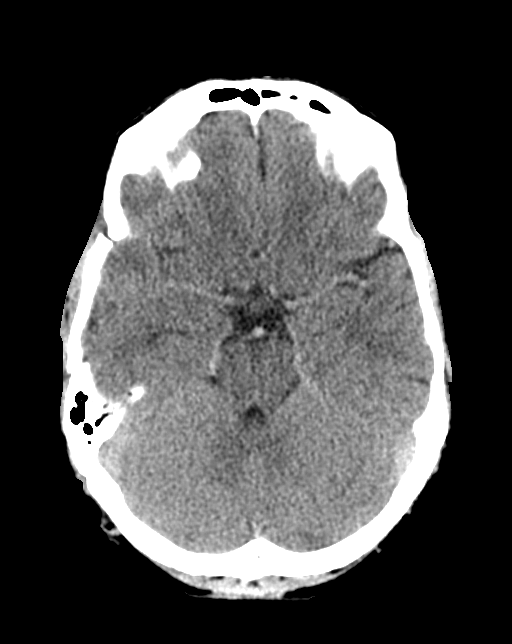
[im 18/35  brain]
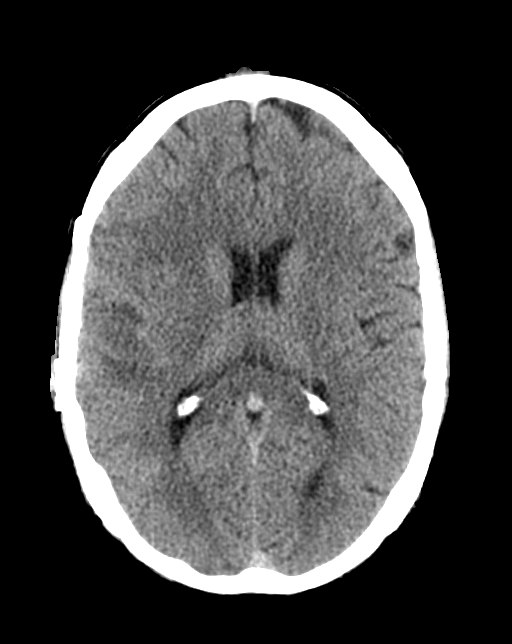

[14 of 47 positions shown; findings below may reference images not displayed]

FINDINGS: Brain: Complex appearance of tumor resection site in the right
temporal lobe with mixed density underlying the craniotomy along the
prior resection site corresponding roughly to the location of the
mixed signal intensity with faint marginal enhancement and complex
fluid in this region along the glioblastoma multiforme resection
bed. I am skeptical of acute hematoma in this region although some
component of the mixed density are high density making blood
products difficult to totally exclude. No new extra-axial fluid
collection identified.

No current midline shift. Basilar cisterns are patent and the
ventricular system appears normal. Basal ganglia unremarkable.

Vascular: There is atherosclerotic calcification of the cavernous
carotid arteries bilaterally.

Skull: Right temporal craniotomy.

Sinuses/Orbits: Chronic left frontal bilateral anterior ethmoid air
cell chronic sinusitis.

Other: Benign cystic lesion of the right forehead scalp.
IMPRESSION: 1. Mixed density along the site of prior GBM resection in the right
temporal lobe. Higher density components make it difficult to
exclude the possibility acute blood product components along this
resection site, although more likely this represents pre-existing
postoperative complex fluid. MRI of the brain with and without
contrast could be used to more specifically characterize this region
and compare to the postoperative brain MRI from [DATE] in
assessing for any recurrent tumor or new acute blood products in the
region. 2
2. Atherosclerosis.
3. Chronic paranasal sinusitis.

## 2022-03-07 MED ORDER — GADOBUTROL 1 MMOL/ML IV SOLN
10.0000 mL | Freq: Once | INTRAVENOUS | Status: AC | PRN
  Administered 2022-03-07: 10 mL via INTRAVENOUS

## 2022-03-07 NOTE — Telephone Encounter (Signed)
Patient seen yesterday by Dr Lacinda Axon who recommended STAT CT head due to fall at work with head injury and Glioblastoma- initially refused but has decided to proceed with CT scam ?

## 2022-03-07 NOTE — Telephone Encounter (Signed)
STAT CT order placed and pt is aware. Randy Gallagher contacted also. Pt verbalized understanding.  ?

## 2022-03-07 NOTE — Telephone Encounter (Signed)
Please order stat CT of the head without contrast to be done today ?Reason concussion, head injury, dizziness, history of glioblastoma ?Was seen yesterday please see documentation ?Please make sure that the results of this are brought to my attention today thank you ?

## 2022-03-07 NOTE — Telephone Encounter (Signed)
Caller name:Irvin Matera  ? ?On DPR? :Yes ? ?Call back [phone] ? ?Provider they see: Lacinda Axon  ? ?Reason for call:Pt called back tried to call yesterday afternoon he wants to go head and have CT Scan done  ? ?

## 2022-03-10 DIAGNOSIS — L720 Epidermal cyst: Secondary | ICD-10-CM | POA: Diagnosis not present

## 2022-03-10 DIAGNOSIS — D2362 Other benign neoplasm of skin of left upper limb, including shoulder: Secondary | ICD-10-CM | POA: Diagnosis not present

## 2022-03-14 DIAGNOSIS — L720 Epidermal cyst: Secondary | ICD-10-CM | POA: Diagnosis not present

## 2022-03-14 DIAGNOSIS — D2362 Other benign neoplasm of skin of left upper limb, including shoulder: Secondary | ICD-10-CM | POA: Diagnosis not present

## 2022-03-22 ENCOUNTER — Telehealth: Payer: Self-pay

## 2022-03-22 NOTE — Telephone Encounter (Signed)
Caller name:Randy Gallagher  ? ?On DPR? :No ? ?Call back [phone] ? ?Provider they see: Lacinda Axon ? ?Reason for call:Pt dropped off short term disability and long term disability forms to be completed placed in Dr Lacinda Axon folder  ? ?

## 2022-03-23 NOTE — Telephone Encounter (Signed)
Patient called to check the status of his forms. He isn't trying to rush just wasn't sure of the status.   CB# [phone]  I did advise him that we would call him once it has been completed.

## 2022-04-26 DIAGNOSIS — C719 Malignant neoplasm of brain, unspecified: Secondary | ICD-10-CM | POA: Diagnosis not present

## 2022-05-10 ENCOUNTER — Ambulatory Visit: Payer: BC Managed Care – PPO | Admitting: Family Medicine

## 2022-05-10 DIAGNOSIS — R519 Headache, unspecified: Secondary | ICD-10-CM

## 2022-05-10 DIAGNOSIS — C719 Malignant neoplasm of brain, unspecified: Secondary | ICD-10-CM | POA: Diagnosis not present

## 2022-05-10 MED ORDER — DEXAMETHASONE 4 MG PO TABS: 4.0000 mg | ORAL_TABLET | Freq: Every day | ORAL | 1 refills | Status: DC

## 2022-05-10 NOTE — Assessment & Plan Note (Signed)
Patient has a progressive and aggressive condition.  I do not feel that it is safe for him to return to work.  I have filled out his disability forms regarding this.  He has follow-up with neurosurgery in August.

## 2022-05-10 NOTE — Assessment & Plan Note (Signed)
Underlying glioblastoma.  Spoke with nurse practitioner with [place] [facility] about his case.  Placing on dexamethasone.

## 2022-05-10 NOTE — Patient Instructions (Signed)
I will talk with Dr. Annette Stable regarding your headaches.  Follow up closely with Dr. Annette Stable.  Call with concerns.  I will fill out your paperwork.  Take care  Dr. Lacinda Axon

## 2022-05-10 NOTE — Progress Notes (Signed)
Subjective:  Patient ID: Randy Gallagher, male    DOB: [birth year]  Age: 51 y.o. MRN: [id number]  CC: Chief Complaint  Patient presents with   Headache    For 7 days- Advil helps a little but it comes back- patient states he does not want to go to hospital    HPI:  51 year old male with glioblastoma presents with complaints of headache.  Patient reports that he is having ongoing headache.  He has had this for approximately 6 days.  Located in the temporal region below his scar.  Severe at times.  He reports that it responds to ibuprofen.  Denies nausea or vomiting.  No vision changes.  He states that he does not want any further treatment regarding his glioblastoma.  He has recently seen his neurosurgeon Dr. Annette Stable on 6/21.  Additionally, patient needs additional disability form filled out.  Patient Active Problem List   Diagnosis Date Noted   Headache 05/10/2022   Glioblastoma ([place]) 02/15/2022   GERD (gastroesophageal reflux disease) 02/15/2022   Tobacco abuse 02/15/2022    Social Hx   Social History   Socioeconomic History   Marital status: Divorced    Spouse name: Not on file   Number of children: Not on file   Years of education: Not on file   Highest education level: Not on file  Occupational History   Not on file  Tobacco Use   Smoking status: Every Day    Packs/day: 1.00    Types: Cigarettes   Smokeless tobacco: Never  Vaping Use   Vaping Use: Never used  Substance and Sexual Activity   Alcohol use: Never   Drug use: Not Currently   Sexual activity: Not on file  Other Topics Concern   Not on file  Social History Narrative   Not on file   Social Determinants of Health   Financial Resource Strain: Not on file  Food Insecurity: Not on file  Transportation Needs: Not on file  Physical Activity: Not on file  Stress: Not on file  Social Connections: Not on file    Review of Systems Per HPI  Objective:  BP 124/84   Ht 5' 10.5" (1.791 m)   Wt 194 lb 6.4  oz (88.2 kg)   BMI 27.50 kg/m      05/10/2022    1:52 PM 03/06/2022    9:19 AM 02/15/2022   10:23 AM  BP/Weight  Systolic BP 850 277 412  Diastolic BP 84 83 70  Wt. (Lbs) 194.4 187.8 188  BMI 27.5 kg/m2 26.57 kg/m2 26.59 kg/m2    Physical Exam Vitals and nursing note reviewed.  Constitutional:      General: He is not in acute distress. Eyes:     General:        Right eye: No discharge.        Left eye: No discharge.     Conjunctiva/sclera: Conjunctivae normal.  Cardiovascular:     Rate and Rhythm: Regular rhythm. Tachycardia present.  Pulmonary:     Effort: Pulmonary effort is normal.     Breath sounds: Normal breath sounds.  Neurological:     Mental Status: He is alert.     Lab Results  Component Value Date   WBC 20.2 (H) 01/03/2022   HGB 16.9 01/03/2022   HCT 51.5 01/03/2022   PLT 391 01/03/2022   GLUCOSE 134 (H) 01/03/2022   ALT 40 01/03/2022   AST 18 01/03/2022   NA 137 01/03/2022   K 4.1  01/03/2022   CL 103 01/03/2022   CREATININE 0.81 01/03/2022   BUN 16 01/03/2022   CO2 26 01/03/2022     Assessment & Plan:   Problem List Items Addressed This Visit       Other   Glioblastoma [facility])    Patient has a progressive and aggressive condition.  I do not feel that it is safe for him to return to work.  I have filled out his disability forms regarding this.  He has follow-up with neurosurgery in August.      Relevant Medications   dexamethasone (DECADRON) 4 MG tablet   Headache    Underlying glioblastoma.  Spoke with nurse practitioner with [place] [facility] about his case.  Placing on dexamethasone.      Follow-up: Has follow-up with neurosurgery in August.  [place]

## 2022-05-25 DIAGNOSIS — R4781 Slurred speech: Secondary | ICD-10-CM | POA: Diagnosis not present

## 2022-05-25 DIAGNOSIS — R29810 Facial weakness: Secondary | ICD-10-CM | POA: Diagnosis not present

## 2022-05-25 DIAGNOSIS — Z5329 Procedure and treatment not carried out because of patient's decision for other reasons: Secondary | ICD-10-CM | POA: Diagnosis not present

## 2022-05-25 DIAGNOSIS — C719 Malignant neoplasm of brain, unspecified: Secondary | ICD-10-CM | POA: Diagnosis not present

## 2022-05-25 DIAGNOSIS — F17210 Nicotine dependence, cigarettes, uncomplicated: Secondary | ICD-10-CM | POA: Diagnosis not present

## 2022-05-25 DIAGNOSIS — R519 Headache, unspecified: Secondary | ICD-10-CM | POA: Diagnosis not present

## 2022-08-17 ENCOUNTER — Ambulatory Visit: Payer: Medicaid Other | Admitting: Family Medicine

## 2022-08-17 DIAGNOSIS — C719 Malignant neoplasm of brain, unspecified: Secondary | ICD-10-CM | POA: Diagnosis not present

## 2022-08-17 NOTE — Patient Instructions (Addendum)
Talk to your sister/brother. You need to go to social services to file for disability and then they will send me the paperwork.   Take care  Dr. Lacinda Axon

## 2022-08-18 NOTE — Assessment & Plan Note (Addendum)
Patient's disease is progressing.  He is having facial weakness and drooling.  He also seems to be having some cognitive issues and difficulties.  He was previously engaged in hospice and this has since been stopped.  I have filled his disability forms out and we will be either returning them to him or faxing them in.

## 2022-08-18 NOTE — Progress Notes (Signed)
Subjective:  Patient ID: Randy Gallagher, male    DOB: 1971/10/18  Age: 51 y.o. MRN: [id number]  CC: Chief Complaint  Patient presents with   long term disability initiation    Currently on short term disability     HPI:  51 year old male with glioblastoma presents for the above.  Patient is currently on short-term disability.  Needs long-term disability.  Forms are located today.  He is having some ongoing headaches which she states are okay at this time.  He is also experiencing drooling which is due to facial weakness.  Has recently had 2 ER trips but left without complete evaluation.  Has been seen by neurosurgery on 8/24.  Patient Active Problem List   Diagnosis Date Noted   Headache 05/10/2022   Glioblastoma ([place]) 02/15/2022   GERD (gastroesophageal reflux disease) 02/15/2022   Tobacco abuse 02/15/2022    Social Hx   Social History   Socioeconomic History   Marital status: Divorced    Spouse name: Not on file   Number of children: Not on file   Years of education: Not on file   Highest education level: Not on file  Occupational History   Not on file  Tobacco Use   Smoking status: Every Day    Packs/day: 1.00    Types: Cigarettes   Smokeless tobacco: Never  Vaping Use   Vaping Use: Never used  Substance and Sexual Activity   Alcohol use: Never   Drug use: Not Currently   Sexual activity: Not on file  Other Topics Concern   Not on file  Social History Narrative   Not on file   Social Determinants of Health   Financial Resource Strain: Not on file  Food Insecurity: Not on file  Transportation Needs: Not on file  Physical Activity: Not on file  Stress: Not on file  Social Connections: Not on file    Review of Systems Per HPI  Objective:  BP (!) 128/91   Pulse (!) 103   Temp 98.2 F (36.8 C)   Ht 5' 10.5" (1.791 m)   Wt 212 lb (96.2 kg)   SpO2 96%   BMI 29.99 kg/m      08/17/2022    2:00 PM 05/10/2022    1:52 PM 03/06/2022    9:19 AM   BP/Weight  Systolic BP 762 263 335  Diastolic BP 91 84 83  Wt. (Lbs) 212 194.4 187.8  BMI 29.99 kg/m2 27.5 kg/m2 26.57 kg/m2    Physical Exam Constitutional:      General: He is not in acute distress. HENT:     Head: Normocephalic and atraumatic.     Mouth/Throat:     Comments: Drooling for the right side of the mouth noted. Eyes:     General:        Right eye: No discharge.        Left eye: No discharge.     Conjunctiva/sclera: Conjunctivae normal.  Cardiovascular:     Rate and Rhythm: Normal rate and regular rhythm.  Pulmonary:     Effort: Pulmonary effort is normal.     Breath sounds: Normal breath sounds.  Neurological:     Mental Status: He is alert.     Comments: Patient experiencing facial weakness which results in drooling. He has difficulty with thoughts and expression at times.     Lab Results  Component Value Date   WBC 20.2 (H) 01/03/2022   HGB 16.9 01/03/2022   HCT 51.5 01/03/2022  PLT 391 01/03/2022   GLUCOSE 134 (H) 01/03/2022   ALT 40 01/03/2022   AST 18 01/03/2022   NA 137 01/03/2022   K 4.1 01/03/2022   CL 103 01/03/2022   CREATININE 0.81 01/03/2022   BUN 16 01/03/2022   CO2 26 01/03/2022     Assessment & Plan:   Problem List Items Addressed This Visit       Other   Glioblastoma ([place])    Patient's disease is progressing.  He is having left-sided facial weakness and drooling.  He also seems to be having some cognitive issues and difficulties.  He was previously engaged in hospice and this has since been stopped.  I have filled his disability forms out and we will be either returning them to him or faxing them in.      Follow-up:  As needed   A total of 30 minutes were spent during this office visit with much of it being face-to-face time with the patient explaining the disability process as well as the progressive nature of his condition.  I also spent time talking with his sister about the disability process and about his current  situation.   [place]

## 2022-10-06 DEATH — deceased
